# Patient Record
Sex: Male | Born: 1975 | Race: Black or African American | Hispanic: No | Marital: Single | Smoking: Never smoker
Health system: Southern US, Community
[De-identification: ages and names within clinical notes are randomized; demographics above are authoritative.]

## PROBLEM LIST (undated history)

## (undated) DIAGNOSIS — E785 Hyperlipidemia, unspecified: Secondary | ICD-10-CM

## (undated) DIAGNOSIS — E669 Obesity, unspecified: Secondary | ICD-10-CM

## (undated) DIAGNOSIS — N492 Inflammatory disorders of scrotum: Secondary | ICD-10-CM

## (undated) DIAGNOSIS — I10 Essential (primary) hypertension: Secondary | ICD-10-CM

## (undated) DIAGNOSIS — E119 Type 2 diabetes mellitus without complications: Secondary | ICD-10-CM

## (undated) HISTORY — DX: Type 2 diabetes mellitus without complications: E11.9

## (undated) HISTORY — DX: Inflammatory disorders of scrotum: N49.2

## (undated) HISTORY — DX: Obesity, unspecified: E66.9

## (undated) HISTORY — DX: Essential (primary) hypertension: I10

## (undated) HISTORY — DX: Hyperlipidemia, unspecified: E78.5

---

## 2000-08-01 ENCOUNTER — Emergency Department (HOSPITAL_COMMUNITY): Admission: EM | Admit: 2000-08-01 | Discharge: 2000-08-01 | Payer: Self-pay | Admitting: *Deleted

## 2000-08-15 ENCOUNTER — Emergency Department (HOSPITAL_COMMUNITY): Admission: EM | Admit: 2000-08-15 | Discharge: 2000-08-15 | Payer: Self-pay | Admitting: Emergency Medicine

## 2000-09-26 ENCOUNTER — Encounter: Admission: RE | Admit: 2000-09-26 | Discharge: 2000-09-26 | Payer: Self-pay | Admitting: Internal Medicine

## 2000-09-30 ENCOUNTER — Emergency Department (HOSPITAL_COMMUNITY): Admission: EM | Admit: 2000-09-30 | Discharge: 2000-09-30 | Payer: Self-pay

## 2000-12-09 ENCOUNTER — Emergency Department (HOSPITAL_COMMUNITY): Admission: EM | Admit: 2000-12-09 | Discharge: 2000-12-09 | Payer: Self-pay | Admitting: Emergency Medicine

## 2001-06-09 ENCOUNTER — Emergency Department (HOSPITAL_COMMUNITY): Admission: EM | Admit: 2001-06-09 | Discharge: 2001-06-09 | Payer: Self-pay | Admitting: Emergency Medicine

## 2003-08-14 ENCOUNTER — Emergency Department (HOSPITAL_COMMUNITY): Admission: EM | Admit: 2003-08-14 | Discharge: 2003-08-14 | Payer: Self-pay | Admitting: Emergency Medicine

## 2004-05-13 DIAGNOSIS — E119 Type 2 diabetes mellitus without complications: Secondary | ICD-10-CM

## 2004-05-13 HISTORY — DX: Type 2 diabetes mellitus without complications: E11.9

## 2005-04-09 ENCOUNTER — Emergency Department (HOSPITAL_COMMUNITY): Admission: EM | Admit: 2005-04-09 | Discharge: 2005-04-10 | Payer: Self-pay | Admitting: Emergency Medicine

## 2007-11-25 ENCOUNTER — Encounter: Payer: Self-pay | Admitting: Family Medicine

## 2007-11-25 LAB — CONVERTED CEMR LAB
HCT: 45.8 %
Hemoglobin: 15.2 g/dL
Hgb A1c MFr Bld: 10.7 %
MCV: 87 fL
Platelets: 299 10*3/uL
TSH: 3.26 microintl units/mL
WBC: 11 10*3/uL

## 2007-12-10 ENCOUNTER — Encounter: Payer: Self-pay | Admitting: Family Medicine

## 2007-12-10 LAB — CONVERTED CEMR LAB
BUN: 12 mg/dL
CO2: 25 meq/L
Calcium: 10.1 mg/dL
Chloride: 103 meq/L
Creatinine, Ser: 0.89 mg/dL
Glucose, Bld: 88 mg/dL
Potassium: 4.3 meq/L
Sodium: 143 meq/L

## 2009-06-20 ENCOUNTER — Encounter: Payer: Self-pay | Admitting: Family Medicine

## 2009-06-20 LAB — CONVERTED CEMR LAB: Vit D, 25-Hydroxy: 4.6 ng/mL

## 2009-07-21 ENCOUNTER — Encounter: Payer: Self-pay | Admitting: Family Medicine

## 2009-07-21 LAB — CONVERTED CEMR LAB
ALT: 21 units/L
AST: 12 units/L
Alkaline Phosphatase: 110 units/L
BUN: 13 mg/dL
CO2: 23 meq/L
Calcium: 10.2 mg/dL
Chloride: 94 meq/L
Creatinine, Ser: 0.96 mg/dL
Glucose, Bld: 421 mg/dL
Hemoglobin: 16.4 g/dL
Hgb A1c MFr Bld: 12.5 %
MCV: 88 fL
Platelets: 324 10*3/uL
Potassium: 4.3 meq/L
Sodium: 134 meq/L
TSH: 2.55 microintl units/mL
Total Bilirubin: 0.5 mg/dL
Total Protein: 8.2 g/dL
WBC: 11 10*3/uL

## 2010-04-17 ENCOUNTER — Ambulatory Visit: Payer: Self-pay | Admitting: Family Medicine

## 2010-04-17 DIAGNOSIS — E785 Hyperlipidemia, unspecified: Secondary | ICD-10-CM

## 2010-04-17 DIAGNOSIS — I10 Essential (primary) hypertension: Secondary | ICD-10-CM | POA: Insufficient documentation

## 2010-04-17 DIAGNOSIS — E113393 Type 2 diabetes mellitus with moderate nonproliferative diabetic retinopathy without macular edema, bilateral: Secondary | ICD-10-CM | POA: Insufficient documentation

## 2010-04-17 DIAGNOSIS — E113391 Type 2 diabetes mellitus with moderate nonproliferative diabetic retinopathy without macular edema, right eye: Secondary | ICD-10-CM | POA: Insufficient documentation

## 2010-04-17 DIAGNOSIS — E113293 Type 2 diabetes mellitus with mild nonproliferative diabetic retinopathy without macular edema, bilateral: Secondary | ICD-10-CM

## 2010-04-17 DIAGNOSIS — E1169 Type 2 diabetes mellitus with other specified complication: Secondary | ICD-10-CM | POA: Insufficient documentation

## 2010-04-18 LAB — CONVERTED CEMR LAB
ALT: 30 units/L (ref 0–53)
AST: 23 units/L (ref 0–37)
Albumin: 4.1 g/dL (ref 3.5–5.2)
Alkaline Phosphatase: 81 units/L (ref 39–117)
BUN: 10 mg/dL (ref 6–23)
Bilirubin, Direct: 0.2 mg/dL (ref 0.0–0.3)
CO2: 30 meq/L (ref 19–32)
Calcium: 9.5 mg/dL (ref 8.4–10.5)
Chloride: 100 meq/L (ref 96–112)
Cholesterol: 172 mg/dL (ref 0–200)
Creatinine, Ser: 0.8 mg/dL (ref 0.4–1.5)
GFR calc non Af Amer: 146.05 mL/min (ref >60.00)
Glucose, Bld: 203 mg/dL — ABNORMAL HIGH (ref 70–99)
HDL: 26.9 mg/dL — ABNORMAL LOW (ref >39.00)
Hgb A1c MFr Bld: 11 % — ABNORMAL HIGH (ref 4.6–6.5)
LDL Cholesterol: 128 mg/dL — ABNORMAL HIGH (ref 0–99)
Potassium: 4 meq/L (ref 3.5–5.1)
Sodium: 139 meq/L (ref 135–145)
TSH: 2 microintl units/mL (ref 0.35–5.50)
Total Bilirubin: 1.1 mg/dL (ref 0.3–1.2)
Total CHOL/HDL Ratio: 6
Total Protein: 8.2 g/dL (ref 6.0–8.3)
Triglycerides: 85 mg/dL (ref 0.0–149.0)
VLDL: 17 mg/dL (ref 0.0–40.0)

## 2010-04-24 ENCOUNTER — Telehealth: Payer: Self-pay | Admitting: Family Medicine

## 2010-04-29 ENCOUNTER — Encounter: Payer: Self-pay | Admitting: Family Medicine

## 2010-05-13 ENCOUNTER — Encounter: Payer: Self-pay | Admitting: Family Medicine

## 2010-05-18 ENCOUNTER — Ambulatory Visit
Admission: RE | Admit: 2010-05-18 | Discharge: 2010-05-18 | Payer: Self-pay | Source: Home / Self Care | Attending: Family Medicine | Admitting: Family Medicine

## 2010-05-22 ENCOUNTER — Telehealth: Payer: Self-pay | Admitting: Family Medicine

## 2010-06-12 NOTE — Assessment & Plan Note (Signed)
Summary: NEW PT TO ESTABH/DLO   Vital Signs:  Patient profile:   35 year old male Height:      71 inches Weight:      319 pounds BMI:     44.65 Temp:     98.7 degrees F oral Pulse rate:   76 / minute Pulse rhythm:   regular BP sitting:   140 / 98  (left arm) Cuff size:   large  Vitals Entered By: Selena Batten Dance CMA Duncan Dull) (April 17, 2010 9:45 AM) CC: New patient to establish care Comments Out of all meds for about 2 weeks   History of Present Illness: CC: new patient, establish  Previously saw [facility] in [place].  switched doctors around alot on him so decided to change to here (closer to home).  Also out of all meds for 2 wks.  1. HTN - losartan/HCTZ.  no HA, vision changes, chest pain, tightness, SOB, urinary changes, LE swelling.  chronic issue  2. DM - janumet 50/1000 two times a day.  had been doing pretty good on this med.  Usually checks in afternoon, 150 postprandial sugars.  Last vision screen several years ago.  Last foot check 2010.  chronic issue  3. HLD - simvastatin 40 daily, no muscle pains.  chronic issue  4. obesity - sedentary lifestyle.  thinks could incorporate walking into routine.  chronic issue  Preventative: declines flu shot. tetanus shot - unsure. hasn't had blood work in a while.  truck driver.  Current Medications (verified): 1)  Janumet 50-1000 Mg Tabs (Sitagliptin-Metformin Hcl) .Marland Kitchen.. 1 By Mouth Two Times A Day 2)  Losartan Potassium-Hctz 100-12.5 Mg Tabs (Losartan Potassium-Hctz) .Marland Kitchen.. 1 By Mouth Once Daily 3)  Simvastatin 40 Mg Tabs (Simvastatin) .Marland Kitchen.. 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    Physical Exam  General:  Well-developed,well-nourished,in no acute distress; alert,appropriate and cooperative throughout examination Head:  Normocephalic and atraumatic without obvious abnormalities. No apparent alopecia or balding. Eyes:  PERRLA, EOMI, no injection Ears:  TMs clear Nose:  nares clear bilaterally Mouth:  MMM, no pharyngeal erythema/edema Neck:  No deformities, masses, or tenderness noted.  no LAD, no thyromegaly Lungs:  Normal respiratory effort, chest expands symmetrically. Lungs are clear to auscultation, no crackles or wheezes. Heart:  Normal rate and regular rhythm. S1 and S2 normal without gallop, murmur, click, rub or other extra sounds. Abdomen:  Bowel sounds positive,abdomen soft and non-tender without masses, organomegaly or hernias noted. Msk:  No deformity or scoliosis noted of thoracic or lumbar spine.   Pulses:  2+ rad pulses Extremities:  no pedaledema Neurologic:  CNs grossly intact, station and gait intact Skin:  Intact without suspicious lesions or rashes Psych:  full affect, pleasant and coooperative  Diabetes Management Exam:    Foot Exam (with socks and/or shoes not present):       Sensory-Pinprick/Light  touch:          Left medial foot (L-4): normal          Left dorsal foot (L-5): normal          Left lateral foot (S-1): normal          Right medial foot (L-4): normal          Right dorsal foot (L-5): normal          Right lateral foot (S-1): normal       Sensory-Monofilament:          Left foot: normal          Right foot: normal       Inspection:          Left foot: normal           Right foot: normal       Nails:          Left foot: normal          Right foot: normal   Impression & Recommendations:  Problem # 1:  OBESITY (ICD-278.00) discussed increasing activity level.  Ht: 71 (04/17/2010)   Wt: 319 (04/17/2010)   BMI: 44.65 (04/17/2010)  Problem # 2:  DIABETES MELLITUS, TYPE II (ICD-250.00) check blood work today.   His updated medication list for this problem includes:    Janumet 50-1000 Mg Tabs (Sitagliptin-metformin hcl) .Marland Kitchen... 1 by mouth two times a day    Losartan Potassium-hctz 100-12.5 Mg Tabs (Losartan potassium-hctz) .Marland Kitchen... 1 by mouth once daily  Orders: TLB-A1C / Hgb A1C (Glycohemoglobin) (83036-A1C)  Problem # 3:  HYPERTENSION (ICD-401.9) elevated today.  off meds x 2 wks.  refilled locally x 1 mo then provided patient with 3 mo supply to mail in. His updated medication list for this problem includes:    Losartan Potassium-hctz 100-12.5 Mg Tabs (Losartan potassium-hctz) .Marland Kitchen... 1 by mouth once daily  Orders: TLB-BMP (Basic Metabolic Panel-BMET) (80048-METABOL) TLB-TSH (Thyroid Stimulating Hormone) (84443-TSH)  BP today: 140/98  Problem # 4:  HYPERLIPIDEMIA (ICD-272.4) check blood work today - fasting. His updated medication list for this problem includes:    Simvastatin 40 Mg Tabs (Simvastatin) .Marland Kitchen... 1 by mouth once daily  Orders: TLB-Lipid Panel (80061-LIPID) TLB-Hepatic/Liver Function Pnl (80076-HEPATIC)  Complete Medication List: 1)  Janumet 50-1000 Mg Tabs (Sitagliptin-metformin hcl) .Marland Kitchen.. 1 by mouth two times a day 2)  Losartan Potassium-hctz 100-12.5 Mg Tabs (Losartan potassium-hctz) .Marland Kitchen.. 1 by mouth once daily 3)  Simvastatin 40 Mg Tabs (Simvastatin) .Marland Kitchen.. 1 by mouth once daily  Patient Instructions: 1)  Release of records from prior PCP ([facility] at [facility] on [address]). 2)  Please return in 1 month for follow up.  Sooner if needed. 3)  Incorporate walking into daily routine. 4)  Watch sodas and juices with  diabetes. 5)  Blood work today. 6)  Good to meet you today, call clinic with questions. Prescriptions: SIMVASTATIN 40 MG TABS (SIMVASTATIN) 1 by mouth once daily  #90 x 3   Entered and Authorized by:   Eustaquio Boyden  MD   Signed by:   Eustaquio Boyden  MD on 04/17/2010   Method used:   Print then Give to Patient   RxID:   [id number] JYNWGNFA POTASSIUM-HCTZ 100-12.5 MG TABS (LOSARTAN POTASSIUM-HCTZ) 1 by mouth once daily  #90 x 3   Entered and Authorized by:   Eustaquio Boyden  MD   Signed by:   Eustaquio Boyden  MD on 04/17/2010   Method  used:   Print then Give to Patient   RxID:   [id number] JANUMET 50-1000 MG TABS (SITAGLIPTIN-METFORMIN HCL) 1 by mouth two times a day  #90 x 3   Entered and Authorized by:   Eustaquio Boyden  MD   Signed by:   Eustaquio Boyden  MD on 04/17/2010   Method used:   Print then Give to Patient   RxID:   [id number] SIMVASTATIN 40 MG TABS (SIMVASTATIN) 1 by mouth once daily  #30 x 1   Entered and Authorized by:   Eustaquio Boyden  MD   Signed by:   Eustaquio Boyden  MD on 04/17/2010   Method used:   Electronically to        [facility]* (retail)       [address]       [place], [place]  [address]       Ph: [phone]       Fax: [phone]   RxID:   [id number] JJKKXFGH POTASSIUM-HCTZ 100-12.5 MG TABS (LOSARTAN POTASSIUM-HCTZ) 1 by mouth once daily  #30 x 1   Entered and Authorized by:   Eustaquio Boyden  MD   Signed by:   Eustaquio Boyden  MD on 04/17/2010   Method used:   Electronically to        [facility]* (retail)       [address]       [place], [place]  [address]       Ph: [phone]       Fax: [phone]   RxID:   [id number] JANUMET 50-1000 MG TABS (SITAGLIPTIN-METFORMIN HCL) 1 by mouth two times a day  #30 x 1   Entered and Authorized by:   Eustaquio Boyden  MD   Signed by:   Eustaquio Boyden  MD on 04/17/2010   Method used:   Electronically to        [facility]* (retail)       [address]       [place], [place]  [address]       Ph: [phone]       Fax: [phone]   RxID:   [id number]    Orders Added: 1)  TLB-Lipid Panel [80061-LIPID] 2)  TLB-BMP (Basic Metabolic Panel-BMET) [80048-METABOL] 3)  TLB-Hepatic/Liver Function Pnl [80076-HEPATIC] 4)  TLB-TSH (Thyroid Stimulating Hormone) [84443-TSH] 5)  TLB-A1C / Hgb A1C (Glycohemoglobin) [83036-A1C] 6)  Est. Patient Level III [[address]]    Current Allergies (reviewed today): No known allergies

## 2010-06-14 NOTE — Medication Information (Signed)
Summary: Clarification for Janumet/[facility]  Clarification for Janumet/[facility]   Imported By: Lanelle Bal 04/30/2010 11:34:04  _____________________________________________________________________  External Attachment:    Type:   Image     Comment:   External Document
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
## 2010-06-14 NOTE — Progress Notes (Signed)
Summary: Outgoing call to patient    ---- Converted from flag ---- ---- 05/21/2010 6:21 PM, Eustaquio Boyden  MD wrote: can we check with patient and offer that to see if he'd be able to go?  if so, let me know and I will put referral into chart.  ---- 05/21/2010 3:27 PM, Selena Batten Dance CMA (AAMA) wrote: The latest they can do is a 4 PM  ---- 05/21/2010 3:04 PM, Eustaquio Boyden  MD wrote: how late friday?  tahnks for checking!  ---- 05/21/2010 2:33 PM, Selena Batten Dance CMA (AAMA) wrote: No weekends at either place. [facility] has late Friday appts where [facility] closes at 12 on Fridays. They couldn't think of anywhere that has weekend hours. Sorry!  ---- 05/21/2010 11:16 AM, Eustaquio Boyden  MD wrote: can you call to see what is available for weekends for diabetes education?  (either at [facility] or [facility]) thanks. ------------------------------ Spoke with patient and he advised he would have to get back to me on whether he could go or not. I explained no weekend classes available and that [facility] had the latest appts on Fridays and they anticipated just 2 classes as a refresher for him. He verbalized understanding and said he would call us back to let us know about the referral. Selena Batten Dance CMA Duncan Dull)  May 22, 2010 3:39 PM   noted. thanks. Eustaquio Boyden  MD  May 22, 2010 5:23 PM

## 2010-06-14 NOTE — Progress Notes (Signed)
Summary: form from [facility]  Phone Note Refill Request Message from:  Fax from Pharmacy  Refills Requested: Medication #1:  JANUMET 50-1000 MG TABS 1 by mouth two times a day Pharmacy is asking for a quantity change, form from [facility] is in your in box.  Initial call taken by: Lowella Petties CMA, AAMA,  April 24, 2010 10:25 AM  Follow-up for Phone Call        done, routed to kim. Follow-up by: Eustaquio Boyden  MD,  April 24, 2010 11:05 AM  Additional Follow-up for Phone Call Additional follow up Details #1::        Form faxed to [facility] Additional Follow-up by: Janee Morn CMA Duncan Dull),  April 24, 2010 11:34 AM    Prescriptions: JANUMET 50-1000 MG TABS (SITAGLIPTIN-METFORMIN HCL) 1 by mouth two times a day  #180 x 3   Entered and Authorized by:   Eustaquio Boyden  MD   Signed by:   Eustaquio Boyden  MD on 04/24/2010   Method used:   Electronically to        [facility]* (retail)             ,          Ph: [phone]       Fax: [phone]   RxID:   [id number]

## 2010-06-18 ENCOUNTER — Ambulatory Visit: Payer: Self-pay | Admitting: Family Medicine

## 2010-07-06 ENCOUNTER — Encounter: Payer: Self-pay | Admitting: Family Medicine

## 2010-07-06 ENCOUNTER — Ambulatory Visit (INDEPENDENT_AMBULATORY_CARE_PROVIDER_SITE_OTHER): Payer: BC Managed Care – PPO | Admitting: Family Medicine

## 2010-07-06 DIAGNOSIS — E669 Obesity, unspecified: Secondary | ICD-10-CM

## 2010-07-06 DIAGNOSIS — E785 Hyperlipidemia, unspecified: Secondary | ICD-10-CM

## 2010-07-06 DIAGNOSIS — I10 Essential (primary) hypertension: Secondary | ICD-10-CM

## 2010-07-06 DIAGNOSIS — E119 Type 2 diabetes mellitus without complications: Secondary | ICD-10-CM

## 2010-07-10 NOTE — Assessment & Plan Note (Signed)
Summary: 1 month f/u JRT   Vital Signs:  Patient profile:   35 year old male Weight:      317 pounds Temp:     98.6 degrees F oral Pulse rate:   68 / minute Pulse rhythm:   regular BP sitting:   140 / 98  (left arm) Cuff size:   large  Vitals Entered By: Lowella Petties CMA, AAMA (July 06, 2010 4:32 PM) CC: One month follow up   History of Present Illness: CC: 1 mo f/u DM  1. DM - brings log of sugars.  actually looking great - 2 hour post prandial range 70-150s. feels more active.  not jump roping as much.   taking janumet.  no paresthesias or numbness fingers or toes.  has not set up with diabetes education.  no lows, lowest has been 79.  knows sxs of low sugar.    2. obesity - tries to walk when he can.  misplaced jump rope.  up 4 lbs since last visit.  tries to watch diet but rarely does salads.  does try to eat grapes and cooked vegetables.  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  3. HTN - checks bp at [facility], running normal.  no HA, vision changes, chest pain, tightness, SOB, urinary changes, LE swelling.    4. HLD - tolerating simvastatin ok.  takes nightly.  Current Medications (verified): 1)  Janumet 50-1000 Mg Tabs (Sitagliptin-Metformin Hcl) .Marland Kitchen.. 1 By Mouth Two Times A Day 2)  Losartan Potassium-Hctz 100-12.5 Mg Tabs (Losartan Potassium-Hctz) .Marland Kitchen.. 1 By Mouth Once Daily 3)  Simvastatin 40 Mg Tabs (Simvastatin) .Marland Kitchen.. 1 By Mouth Once Nightly For Cholesterol 4)  Glimepiride 2 Mg Tabs (Glimepiride) .... One Daily 5)  Multivitamins  Tabs (Multiple Vitamin) .... 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 No apparent alopecia or balding. Mouth:  MMM, no pharyngeal erythema/edema Neck:  No deformities, masses, or tenderness noted.  no LAD, no thyromegaly Lungs:  Normal respiratory effort, chest expands symmetrically. Lungs are clear to auscultation, no crackles or wheezes. Heart:  Normal rate and regular rhythm. S1 and S2 normal without gallop, murmur, click, rub or other extra sounds. Pulses:  2+ rad pulses   Impression & Recommendations:  Problem # 1:  DIABETES MELLITUS, TYPE II (ICD-250.00) much improved based on blood sugars shared today.  no lows.  continue current meds.  due for A1c next visit.  His updated medication list for this problem includes:    Janumet 50-1000 Mg Tabs (Sitagliptin-metformin hcl) .Marland Kitchen... 1 by mouth two times a day    Losartan Potassium-hctz 100-25 Mg Tabs (Losartan potassium-hctz) .Marland Kitchen... Take one daily for blood pressure    Glimepiride 2 Mg Tabs (Glimepiride) ..... One daily  Labs Reviewed: Creat: 0.8 (04/17/2010)    Reviewed HgBA1c results: 11.0 (04/17/2010)  12.5 (07/21/2009)  Problem # 2:  HYPERTENSION (ICD-401.9) Assessment: Unchanged not optimal,  neg ros.  increase HCTZ component.  His updated medication list for this problem includes:    Losartan Potassium-hctz 100-25 Mg Tabs (Losartan potassium-hctz) .Marland Kitchen... Take one daily for blood pressure  BP today: 140/98 Prior BP: 132/96 (05/18/2010)  Labs Reviewed: K+: 4.0 (04/17/2010) Creat: : 0.8 (04/17/2010)   Chol: 172 (04/17/2010)   HDL: 26.90 (04/17/2010)   LDL: 128 (04/17/2010)   TG: 85.0 (04/17/2010)  Problem # 3:  OBESITY (ICD-278.00)  wt up.  rec restart jump rope, walking.  Ht: 71 (04/17/2010)   Wt: 317 (07/06/2010)   BMI: 44.65  (04/17/2010)  Problem # 4:  HYPERLIPIDEMIA (ICD-272.4) last LDL too high, tolerating simvastatin.  change to more potent atorvastatin when generic. His updated medication list for this problem includes:    Simvastatin 40 Mg Tabs (Simvastatin) .Marland Kitchen... 1 by mouth once nightly for cholesterol  Labs Reviewed: SGOT: 23 (04/17/2010)   SGPT: 30 (04/17/2010)   HDL:26.90 (04/17/2010)  LDL:128 (04/17/2010)  Chol:172 (04/17/2010)  Trig:85.0 (04/17/2010)  Complete Medication List: 1)  Janumet 50-1000 Mg Tabs (Sitagliptin-metformin hcl) .Marland Kitchen.. 1 by mouth two times a day 2)  Losartan Potassium-hctz 100-25 Mg Tabs (Losartan potassium-hctz) .... Take one daily for blood pressure 3)  Simvastatin 40 Mg Tabs (Simvastatin) .Marland Kitchen.. 1 by mouth once nightly for cholesterol 4)  Glimepiride 2 Mg Tabs (Glimepiride) .... One daily  Patient Instructions: 1)  Return late march or early april last appt on friday for f/u. 2)  goal blood pressure for you is <130/80. 3)  call to set up diabetes education. 4)  call to set up vision check. 5)  increase losartan hctz to 100/25 daily for blood pressure. 6)  Good to see you today, call clinic with questions. Prescriptions: LOSARTAN POTASSIUM-HCTZ 100-25 MG TABS (LOSARTAN POTASSIUM-HCTZ) take one daily for blood pressure  #90 x 1   Entered and Authorized by:   Eustaquio Boyden  MD   Signed by:   Eustaquio Boyden  MD on 07/06/2010   Method used:   Electronically to        [facility]* (retail)             ,          Ph: [phone]       Fax: [phone]   RxID:   [id number] GLIMEPIRIDE 2 MG TABS (GLIMEPIRIDE) one daily  #90 x 1   Entered and Authorized by:   Eustaquio Boyden  MD   Signed by:   Eustaquio Boyden  MD on 07/06/2010   Method used:   Electronically to        [facility]* (retail)             ,          Ph: [phone]       Fax: [phone]   RxID:   [id number]    Orders Added: 1)  Est. Patient Level IV [88416]    Prior  Medications (reviewed today): JANUMET 50-1000 MG TABS (SITAGLIPTIN-METFORMIN HCL) 1 by mouth two times a day GLIMEPIRIDE 2 MG TABS (GLIMEPIRIDE) one daily Current Allergies (reviewed today): No known allergies

## 2010-07-16 ENCOUNTER — Encounter: Payer: Self-pay | Admitting: Family Medicine

## 2010-07-16 DIAGNOSIS — E669 Obesity, unspecified: Secondary | ICD-10-CM

## 2010-07-16 DIAGNOSIS — I10 Essential (primary) hypertension: Secondary | ICD-10-CM

## 2010-07-16 DIAGNOSIS — E785 Hyperlipidemia, unspecified: Secondary | ICD-10-CM

## 2010-07-16 DIAGNOSIS — E119 Type 2 diabetes mellitus without complications: Secondary | ICD-10-CM

## 2010-09-07 ENCOUNTER — Ambulatory Visit: Payer: BC Managed Care – PPO | Admitting: Family Medicine

## 2010-09-14 ENCOUNTER — Ambulatory Visit (INDEPENDENT_AMBULATORY_CARE_PROVIDER_SITE_OTHER): Payer: BC Managed Care – PPO | Admitting: Family Medicine

## 2010-09-14 ENCOUNTER — Encounter: Payer: Self-pay | Admitting: Family Medicine

## 2010-09-14 VITALS — BP 122/82 | HR 80 | Temp 98.4°F | Ht 71.0 in | Wt 317.0 lb

## 2010-09-14 DIAGNOSIS — E669 Obesity, unspecified: Secondary | ICD-10-CM

## 2010-09-14 DIAGNOSIS — E785 Hyperlipidemia, unspecified: Secondary | ICD-10-CM

## 2010-09-14 DIAGNOSIS — I10 Essential (primary) hypertension: Secondary | ICD-10-CM

## 2010-09-14 DIAGNOSIS — E119 Type 2 diabetes mellitus without complications: Secondary | ICD-10-CM

## 2010-09-14 DIAGNOSIS — Z23 Encounter for immunization: Secondary | ICD-10-CM

## 2010-09-14 NOTE — Progress Notes (Signed)
  Subjective:    Patient ID: Jason Whitney, male    DOB: 05-30-1975, 35 y.o.   MRN: [id number]  HPI CC: 29mo f/u  Truck driver.  Issues dealing with [facility] - states thinking about changing pharmacy to [facility].  DM - janumet and glipizide daily.  Previously went through [facility] and cheaper.  No vision check recently.  States will schedule.  No low sugars.  No highs.  Trying to watch diet.  Weight 317lbs.  Checked today 3 hours after lunch, 87.  Fasting running low 80.  After meals running 80-120s.  HTN - No HA, vision changes, CP/tightness, SOB, leg swelling.  Compliant with meds.    HLD - tolerating zocor daily.  No myalgias.  Wt Readings from Last 3 Encounters:  09/14/10 317 lb 0.6 oz (143.808 kg)  07/06/10 317 lb (143.79 kg)  05/18/10 313 lb 4 oz (142.089 kg)   Medications and allergies reviewed and updated as above. PMhx reviewed for relevance  Review of Systems Per HPI    Objective:   Physical Exam  Vitals reviewed. Constitutional: He appears well-developed and well-nourished. No distress.  HENT:  Head: Normocephalic and atraumatic.  Mouth/Throat: Oropharynx is clear and moist. No oropharyngeal exudate.  Eyes: Conjunctivae are normal. Pupils are equal, round, and reactive to light. No scleral icterus.  Neck: Normal range of motion. Neck supple. Carotid bruit is not present.  Cardiovascular: Normal rate, regular rhythm, normal heart sounds and intact distal pulses.   No murmur heard. Pulmonary/Chest: Effort normal and breath sounds normal. No respiratory distress. He has no wheezes. He has no rales.  Abdominal: Soft. Bowel sounds are normal.       No abd/renal bruits  Lymphadenopathy:    He has no cervical adenopathy.  Skin: Skin is warm. No rash noted.  Psychiatric: He has a normal mood and affect.       Diabetic foot exam: Normal inspection No skin breakdown No calluses  Normal DP/PT pulses Normal sensation to light tough and monofilament Nails  normal    Assessment & Plan:

## 2010-09-14 NOTE — Patient Instructions (Signed)
Blood work today. Return in 3 months for follow up. Keep log 1 week prior to returning with fasting sugars and then sugars either 2 hours after lunch or dinner.  Bring for [place] to review. Let me know where you want meds sent. Call us with questions.

## 2010-09-15 LAB — HEMOGLOBIN A1C
Hgb A1c MFr Bld: 7.1 % — ABNORMAL HIGH (ref <5.7)
Mean Plasma Glucose: 157 mg/dL — ABNORMAL HIGH (ref <117)

## 2010-09-15 LAB — BASIC METABOLIC PANEL
CO2: 25 mEq/L (ref 19–32)
Chloride: 97 mEq/L (ref 96–112)
Sodium: 137 mEq/L (ref 135–145)

## 2010-09-16 ENCOUNTER — Encounter: Payer: Self-pay | Admitting: Family Medicine

## 2010-09-16 ENCOUNTER — Telehealth: Payer: Self-pay | Admitting: Family Medicine

## 2010-09-16 MED ORDER — ATORVASTATIN CALCIUM 20 MG PO TABS: 20.0000 mg | ORAL_TABLET | Freq: Every day | ORAL | Status: DC

## 2010-09-16 NOTE — Assessment & Plan Note (Signed)
Continue med, due for recheck.  Not fasting today. Lab Results  Component Value Date   LDLCALC 128* 04/17/2010

## 2010-09-16 NOTE — Assessment & Plan Note (Signed)
Lab Results  Component Value Date   HGBA1C 7.1* 09/14/2010   HGBA1C 11.0* 04/17/2010   HGBA1C 12.5 07/21/2009   Lab Results  Component Value Date   LDLCALC 128* 04/17/2010   CREATININE 0.97 09/14/2010   Better control per numbers, check again today. Due for vision screen - pt states will schedule. Foot exam today. Encouraged continued use of meds.

## 2010-09-16 NOTE — Assessment & Plan Note (Signed)
Good control.  Continue meds.  BP Readings from Last 3 Encounters:  09/14/10 122/82  07/06/10 140/98  05/18/10 132/96

## 2010-09-16 NOTE — Telephone Encounter (Signed)
Please notify A1c 7.1% - GREAT IMPROVEMENT!! from prior of 11.  Continue meds as up to now.  When returns for 3 mo f/u will look at log of sugars and see if any med changes needed. Electrolytes and kidney function normal. LDL improved at 113 but still above goal of <100.  Recommend change from simvastatin to atorvastatin (more potent statin).  May price out first.  Sent to pharmacy [facility]).

## 2010-09-17 NOTE — Progress Notes (Signed)
Addended by: Josph Macho on: 09/17/2010 10:47 AM   Modules accepted: Orders

## 2010-09-17 NOTE — Telephone Encounter (Signed)
Patient notified and was VERY happy with improvements! He will continue DM meds and will change to atorvastatin when it arrives.

## 2010-12-14 ENCOUNTER — Ambulatory Visit: Payer: BC Managed Care – PPO | Admitting: Family Medicine

## 2011-01-25 ENCOUNTER — Ambulatory Visit: Payer: BC Managed Care – PPO | Admitting: Family Medicine

## 2011-07-31 ENCOUNTER — Other Ambulatory Visit: Payer: Self-pay | Admitting: *Deleted

## 2011-07-31 ENCOUNTER — Other Ambulatory Visit: Payer: Self-pay | Admitting: Family Medicine

## 2011-07-31 MED ORDER — LOSARTAN POTASSIUM-HCTZ 100-25 MG PO TABS: 1.0000 | ORAL_TABLET | Freq: Every day | ORAL | Status: DC

## 2011-07-31 MED ORDER — SITAGLIPTIN PHOS-METFORMIN HCL 50-1000 MG PO TABS: 1.0000 | ORAL_TABLET | Freq: Two times a day (BID) | ORAL | Status: DC

## 2011-07-31 MED ORDER — GLIMEPIRIDE 2 MG PO TABS: 2.0000 mg | ORAL_TABLET | Freq: Every day | ORAL | Status: DC

## 2011-12-04 ENCOUNTER — Other Ambulatory Visit: Payer: Self-pay | Admitting: *Deleted

## 2011-12-04 MED ORDER — GLUCOSE BLOOD VI STRP: ORAL_STRIP | Status: DC

## 2011-12-11 ENCOUNTER — Ambulatory Visit (INDEPENDENT_AMBULATORY_CARE_PROVIDER_SITE_OTHER): Payer: BC Managed Care – PPO | Admitting: Family Medicine

## 2011-12-11 ENCOUNTER — Encounter: Payer: Self-pay | Admitting: Family Medicine

## 2011-12-11 VITALS — BP 140/92 | HR 68 | Temp 98.0°F | Wt 328.2 lb

## 2011-12-11 DIAGNOSIS — E669 Obesity, unspecified: Secondary | ICD-10-CM

## 2011-12-11 DIAGNOSIS — E119 Type 2 diabetes mellitus without complications: Secondary | ICD-10-CM

## 2011-12-11 DIAGNOSIS — E785 Hyperlipidemia, unspecified: Secondary | ICD-10-CM

## 2011-12-11 DIAGNOSIS — I10 Essential (primary) hypertension: Secondary | ICD-10-CM

## 2011-12-11 NOTE — Assessment & Plan Note (Signed)
Chronic.  Some deteriorated.  weight gain noted. BP Readings from Last 3 Encounters:  12/11/11 140/92  09/14/10 122/82  07/06/10 140/98   continue meds.

## 2011-12-11 NOTE — Progress Notes (Signed)
  Subjective:    Patient ID: Jason Whitney, male    DOB: [birth year], 36 y.o.   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  HTN - elevated today.  Had DOT physical Friday, states bp was 139/88.  No HA, vision changes, CP/tightness, SOB, leg swelling.   DM - checked 2 days ago, fasting 75.  Checks about qod.  No low sugars.  No hypoglycemic sxs.  No paresthesias.  Vision exam - last august.  Due for this.  Foot exam today.   Lab Results  Component Value Date   HGBA1C 7.1* 09/14/2010   HLD - compliant with lipitor, but takes in am.  No myalgias.  Obesity - 11lb weight gain in last year. Wt Readings from Last 3 Encounters:  12/11/11 328 lb 4 oz (148.893 kg)  09/14/10 317 lb 0.6 oz (143.808 kg)  07/06/10 317 lb (143.79 kg)    Prefers not to have meds refilled today, would like to change pharmacy to [facility].  Past Medical History  Diagnosis Date  . HTN (hypertension)   . HLD (hyperlipidemia)   . Diabetes mellitus type II 2006  . Obesity    Current Outpatient Prescriptions on File Prior to Visit  Medication Sig Dispense Refill  . atorvastatin (LIPITOR) 20 MG tablet Take 1 tablet (20 mg total) by mouth at bedtime.  90 tablet  3  . glimepiride (AMARYL) 2 MG tablet TAKE 1 TABLET DAILY  90 tablet  0  . glucose blood test strip Test daily as directed.  100 each  3  . JANUMET 50-1000 MG per tablet TAKE 1 TABLET TWICE A DAY  180 tablet  0  . losartan-hydrochlorothiazide (HYZAAR) 100-25 MG per tablet TAKE 1 TABLET DAILY FOR BLOOD PRESSURE  90 tablet  0    Review of Systems Per HPI    Objective:   Physical Exam  Nursing note and vitals reviewed. Constitutional: He appears well-developed and well-nourished. No distress.  HENT:  Head: Normocephalic and atraumatic.  Right Ear: External ear normal.  Left Ear: External ear normal.  Nose: Nose normal.  Mouth/Throat: Oropharynx is clear and moist. No oropharyngeal  exudate.  Eyes: Conjunctivae and EOM are normal. Pupils are equal, round, and reactive to light. No scleral icterus.  Neck: Normal range of motion. Neck supple. Carotid bruit is not present.  Cardiovascular: Normal rate, regular rhythm, normal heart sounds and intact distal pulses.   No murmur heard. Pulmonary/Chest: Effort normal and breath sounds normal. No respiratory distress. He has no wheezes. He has no rales.  Musculoskeletal: He exhibits no edema.       cDiabetic foot exam: Normal inspection No skin breakdown No calluses  Normal DP/PT pulses Normal sensation to light tough and monofilament Nails normal  Lymphadenopathy:    He has no cervical adenopathy.  Skin: Skin is warm and dry. No rash noted.  Psychiatric: He has a normal mood and affect.       Assessment & Plan:

## 2011-12-11 NOTE — Assessment & Plan Note (Signed)
Chronic. Discussed PM vs AM dosing.  rec whichever he will more easily remember.

## 2011-12-11 NOTE — Patient Instructions (Addendum)
Good to see you today. Blood work today. Return in 6 months for follow up. Call us with questions. No changes today.  work on increased activity - start with 30 min of aerobic exercise 3-4 times daily.

## 2011-12-11 NOTE — Assessment & Plan Note (Signed)
Wt Readings from Last 3 Encounters:  12/11/11 328 lb 4 oz (148.893 kg)  09/14/10 317 lb 0.6 oz (143.808 kg)  07/06/10 317 lb (143.79 kg)  discussed weight.  Encouraged regular exercise, careful adherence to diabetic diet. Difficult given his work, but encouraged to continue working towards this.

## 2011-12-11 NOTE — Assessment & Plan Note (Signed)
Chronic, stable.  Anticipate good control based on recalled readings.  No log today.  Checks sugars QOD Check blood work today. Encouraged continued compliance with meds.

## 2011-12-12 LAB — COMPREHENSIVE METABOLIC PANEL
CO2: 30 mEq/L (ref 19–32)
Creatinine, Ser: 1 mg/dL (ref 0.4–1.5)
GFR: 105.27 mL/min (ref >60.00)
Glucose, Bld: 103 mg/dL — ABNORMAL HIGH (ref 70–99)
Total Bilirubin: 0.7 mg/dL (ref 0.3–1.2)
Total Protein: 8.5 g/dL — ABNORMAL HIGH (ref 6.0–8.3)

## 2011-12-12 LAB — LIPID PANEL
Cholesterol: 199 mg/dL (ref 0–200)
HDL: 32.4 mg/dL — ABNORMAL LOW (ref >39.00)
Triglycerides: 128 mg/dL (ref 0.0–149.0)

## 2011-12-12 LAB — MICROALBUMIN / CREATININE URINE RATIO
Creatinine,U: 191.5 mg/dL
Microalb Creat Ratio: 2.7 mg/g (ref 0.0–30.0)

## 2011-12-12 LAB — HEMOGLOBIN A1C: Hgb A1c MFr Bld: 9.6 % — ABNORMAL HIGH (ref 4.6–6.5)

## 2012-01-28 ENCOUNTER — Other Ambulatory Visit: Payer: Self-pay

## 2012-01-28 MED ORDER — ATORVASTATIN CALCIUM 20 MG PO TABS: 20.0000 mg | ORAL_TABLET | Freq: Every day | ORAL | Status: DC

## 2012-01-28 MED ORDER — SITAGLIPTIN PHOS-METFORMIN HCL 50-1000 MG PO TABS: 1.0000 | ORAL_TABLET | Freq: Two times a day (BID) | ORAL | Status: DC

## 2012-01-28 MED ORDER — GLIMEPIRIDE 2 MG PO TABS: 2.0000 mg | ORAL_TABLET | Freq: Every day | ORAL | Status: DC

## 2012-01-28 MED ORDER — LOSARTAN POTASSIUM-HCTZ 100-25 MG PO TABS: 1.0000 | ORAL_TABLET | Freq: Every day | ORAL | Status: DC

## 2012-01-28 NOTE — Telephone Encounter (Signed)
Pt request written rx for atorvastatin,glimepiride, janumet and losartan -HCTZ. Call when ready for pick up.

## 2012-01-28 NOTE — Telephone Encounter (Signed)
Patient notified and Rx placed up front for pick up. 

## 2012-01-28 NOTE — Telephone Encounter (Signed)
Printed out.  plz notify ready.

## 2012-01-30 NOTE — Telephone Encounter (Signed)
Pt brought back rx to be faxed to [facility] [phone]. Due to insurance guidelines 90 day rx cannot be filled at local pharmacy. Pt to ck with [facility] to verify received rxs and are processing. Pt understood.

## 2012-02-04 ENCOUNTER — Other Ambulatory Visit: Payer: Self-pay | Admitting: *Deleted

## 2012-02-04 MED ORDER — SITAGLIPTIN PHOS-METFORMIN HCL 50-1000 MG PO TABS: 1.0000 | ORAL_TABLET | Freq: Two times a day (BID) | ORAL | Status: DC

## 2012-02-07 ENCOUNTER — Other Ambulatory Visit: Payer: Self-pay | Admitting: Family Medicine

## 2012-02-07 ENCOUNTER — Other Ambulatory Visit: Payer: Self-pay | Admitting: *Deleted

## 2012-02-07 MED ORDER — GLIMEPIRIDE 2 MG PO TABS: 2.0000 mg | ORAL_TABLET | Freq: Every day | ORAL | Status: DC

## 2012-02-07 MED ORDER — LOSARTAN POTASSIUM-HCTZ 100-25 MG PO TABS: 1.0000 | ORAL_TABLET | Freq: Every day | ORAL | Status: DC

## 2012-02-07 MED ORDER — SITAGLIPTIN PHOS-METFORMIN HCL 50-1000 MG PO TABS: 1.0000 | ORAL_TABLET | Freq: Two times a day (BID) | ORAL | Status: DC

## 2012-02-07 MED ORDER — ATORVASTATIN CALCIUM 20 MG PO TABS: 20.0000 mg | ORAL_TABLET | Freq: Every day | ORAL | Status: DC

## 2012-02-07 NOTE — Telephone Encounter (Signed)
The patient called the triage line hoping to get his four medications refilled through [facility].  He stated [facility] did not have the rx information.  He is hoping for a 90 day supply.   Thanks!

## 2012-02-07 NOTE — Telephone Encounter (Signed)
Pt request refills atorvastatin,glimepiride, janumet and losartan hctz to [facility]. Sent electronically; pt notified by phone.

## 2012-02-07 NOTE — Telephone Encounter (Signed)
Refilled meds as requested; pt will ck with [facility] in 2 days to verify received refills and are processing.

## 2012-02-11 ENCOUNTER — Other Ambulatory Visit: Payer: Self-pay | Admitting: *Deleted

## 2012-02-11 ENCOUNTER — Telehealth: Payer: Self-pay | Admitting: *Deleted

## 2012-02-11 MED ORDER — GLIMEPIRIDE 2 MG PO TABS: 2.0000 mg | ORAL_TABLET | Freq: Every day | ORAL | Status: DC

## 2012-02-11 NOTE — Telephone Encounter (Signed)
Signed and handed to Kim 

## 2012-02-11 NOTE — Telephone Encounter (Signed)
Form for glimepiride refill in your IN box. Requires signature. Cannot get [employer] to go through after numerous attempts.

## 2012-02-11 NOTE — Telephone Encounter (Signed)
Form faxed

## 2012-03-13 ENCOUNTER — Ambulatory Visit: Payer: BC Managed Care – PPO | Admitting: Family Medicine

## 2012-05-22 ENCOUNTER — Ambulatory Visit: Payer: BC Managed Care – PPO | Admitting: Family Medicine

## 2012-06-12 ENCOUNTER — Ambulatory Visit: Payer: BC Managed Care – PPO | Admitting: Family Medicine

## 2012-11-19 ENCOUNTER — Other Ambulatory Visit: Payer: Self-pay

## 2012-11-30 ENCOUNTER — Encounter: Payer: Self-pay | Admitting: Family Medicine

## 2012-11-30 ENCOUNTER — Ambulatory Visit (INDEPENDENT_AMBULATORY_CARE_PROVIDER_SITE_OTHER): Payer: BC Managed Care – PPO | Admitting: Family Medicine

## 2012-11-30 VITALS — BP 138/96 | HR 100 | Temp 98.8°F | Wt 320.8 lb

## 2012-11-30 DIAGNOSIS — Z23 Encounter for immunization: Secondary | ICD-10-CM

## 2012-11-30 DIAGNOSIS — E1165 Type 2 diabetes mellitus with hyperglycemia: Secondary | ICD-10-CM

## 2012-11-30 DIAGNOSIS — I10 Essential (primary) hypertension: Secondary | ICD-10-CM

## 2012-11-30 DIAGNOSIS — E669 Obesity, unspecified: Secondary | ICD-10-CM

## 2012-11-30 DIAGNOSIS — IMO0001 Reserved for inherently not codable concepts without codable children: Secondary | ICD-10-CM

## 2012-11-30 DIAGNOSIS — E785 Hyperlipidemia, unspecified: Secondary | ICD-10-CM

## 2012-11-30 LAB — BASIC METABOLIC PANEL
Chloride: 104 mEq/L (ref 96–112)
Creatinine, Ser: 1.2 mg/dL (ref 0.4–1.5)
Potassium: 4.2 mEq/L (ref 3.5–5.1)
Sodium: 139 mEq/L (ref 135–145)

## 2012-11-30 LAB — LIPID PANEL
Total CHOL/HDL Ratio: 8
VLDL: 22.4 mg/dL (ref 0.0–40.0)

## 2012-11-30 LAB — MICROALBUMIN / CREATININE URINE RATIO
Microalb Creat Ratio: 2.8 mg/g (ref 0.0–30.0)
Microalb, Ur: 2.5 mg/dL — ABNORMAL HIGH (ref 0.0–1.9)

## 2012-11-30 LAB — LDL CHOLESTEROL, DIRECT: Direct LDL: 173.9 mg/dL

## 2012-11-30 LAB — HEMOGLOBIN A1C: Hgb A1c MFr Bld: 10.1 % — ABNORMAL HIGH (ref 4.6–6.5)

## 2012-11-30 NOTE — Assessment & Plan Note (Signed)
Hronic. Too high diastolics today. Continue ARB/HCTZ - recommended start taking in am - anticipate improved control with this change.

## 2012-11-30 NOTE — Assessment & Plan Note (Signed)
Chronic, stable. Continue meds as up to now. Check blood work today including A1c, BMP and microalb.

## 2012-11-30 NOTE — Progress Notes (Signed)
  Subjective:    Patient ID: Jason Whitney, male    DOB: 02-Aug-1975, 37 y.o.   MRN: [id number]  HPI CC: 1 year f/u, med refill.  HTN - on hyzaar daily, compliant.  No HA, vision changes, CP/tightness, SOB, leg swelling.   HLD - tolerating lipitor well.  Nightly.  DM - compliant with amaryl and janumet.  Running 116-150s 2 hours after meals.  Denies low sugars or hypoglycemic sxs.  Minimal toe paresthesias.  Has not had pneumovax in past.  Will provide today.  Eye exam - about to reschedule because missed prior.  Foot exam today. Lab Results  Component Value Date   HGBA1C 9.6* 12/11/2011   Obesity - No regular exercise.  Some dietary indiscretions.  Past Medical History  Diagnosis Date  . HTN (hypertension)   . HLD (hyperlipidemia)   . Diabetes mellitus type II 2006  . Obesity     Review of Systems Per HPI    Objective:   Physical Exam  Nursing note and vitals reviewed. Constitutional: He appears well-developed and well-nourished. No distress.  obese  HENT:  Head: Normocephalic and atraumatic.  Right Ear: External ear normal.  Left Ear: External ear normal.  Nose: Nose normal.  Mouth/Throat: Oropharynx is clear and moist. No oropharyngeal exudate.  Eyes: Conjunctivae and EOM are normal. Pupils are equal, round, and reactive to light. No scleral icterus.  Neck: Normal range of motion. Neck supple.  Cardiovascular: Normal rate, regular rhythm, normal heart sounds and intact distal pulses.   No murmur heard. Pulmonary/Chest: Effort normal and breath sounds normal. No respiratory distress. He has no wheezes. He has no rales.  Musculoskeletal: He exhibits no edema.  Diabetic foot exam: Normal inspection No skin breakdown No calluses  Normal DP/PT pulses Normal sensation to light touch and monofilament Nails normal  Lymphadenopathy:    He has no cervical adenopathy.  Skin: Skin is warm and dry. No rash noted.  Psychiatric: He has a normal mood and affect.       Assessment & Plan:

## 2012-11-30 NOTE — Patient Instructions (Signed)
Start taking blood pressure medicine in the morning. Blood work today to check sugar and cholesterol levels. Good to see you today, call us with questions. Pneumonia shot today. assuming good blood work results, return to see me in 6 months for diabetes follow up. Schedule eye exam.

## 2012-11-30 NOTE — Addendum Note (Signed)
Addended by: Josph Macho A on: 11/30/2012 11:07 AM   Modules accepted: Orders

## 2012-11-30 NOTE — Assessment & Plan Note (Signed)
Encouraged start regular exercise program. Discussed healthy diet choices.

## 2012-11-30 NOTE — Assessment & Plan Note (Signed)
Tolerating lipitor well. Continue med.

## 2012-12-01 ENCOUNTER — Other Ambulatory Visit: Payer: Self-pay | Admitting: Family Medicine

## 2012-12-01 MED ORDER — GLIMEPIRIDE 4 MG PO TABS: 4.0000 mg | ORAL_TABLET | Freq: Every day | ORAL | Status: DC

## 2013-04-05 ENCOUNTER — Other Ambulatory Visit: Payer: Self-pay | Admitting: *Deleted

## 2013-04-05 MED ORDER — LOSARTAN POTASSIUM-HCTZ 100-25 MG PO TABS: 1.0000 | ORAL_TABLET | Freq: Every day | ORAL | Status: DC

## 2013-04-05 MED ORDER — SITAGLIPTIN PHOS-METFORMIN HCL 50-1000 MG PO TABS: 1.0000 | ORAL_TABLET | Freq: Two times a day (BID) | ORAL | Status: DC

## 2013-04-05 MED ORDER — ATORVASTATIN CALCIUM 20 MG PO TABS: 20.0000 mg | ORAL_TABLET | Freq: Every day | ORAL | Status: DC

## 2013-04-05 MED ORDER — GLIMEPIRIDE 4 MG PO TABS: 4.0000 mg | ORAL_TABLET | Freq: Every day | ORAL | Status: DC

## 2013-04-06 ENCOUNTER — Ambulatory Visit (HOSPITAL_COMMUNITY)
Admission: RE | Admit: 2013-04-06 | Discharge: 2013-04-06 | Disposition: A | Discharge status: Home / Self Care | Payer: BC Managed Care – PPO | Source: Ambulatory Visit | Attending: Family Medicine | Admitting: Family Medicine

## 2013-04-06 ENCOUNTER — Encounter: Payer: Self-pay | Admitting: *Deleted

## 2013-04-06 ENCOUNTER — Ambulatory Visit (INDEPENDENT_AMBULATORY_CARE_PROVIDER_SITE_OTHER): Payer: BC Managed Care – PPO | Admitting: Family Medicine

## 2013-04-06 ENCOUNTER — Encounter: Payer: Self-pay | Admitting: Family Medicine

## 2013-04-06 VITALS — BP 142/90 | HR 76 | Temp 98.6°F | Wt 324.2 lb

## 2013-04-06 DIAGNOSIS — R1909 Other intra-abdominal and pelvic swelling, mass and lump: Secondary | ICD-10-CM | POA: Insufficient documentation

## 2013-04-06 DIAGNOSIS — N508 Other specified disorders of male genital organs: Secondary | ICD-10-CM | POA: Insufficient documentation

## 2013-04-06 DIAGNOSIS — N5089 Other specified disorders of the male genital organs: Secondary | ICD-10-CM

## 2013-04-06 DIAGNOSIS — R21 Rash and other nonspecific skin eruption: Secondary | ICD-10-CM

## 2013-04-06 DIAGNOSIS — E1165 Type 2 diabetes mellitus with hyperglycemia: Secondary | ICD-10-CM

## 2013-04-06 DIAGNOSIS — N492 Inflammatory disorders of scrotum: Secondary | ICD-10-CM

## 2013-04-06 HISTORY — DX: Inflammatory disorders of scrotum: N49.2

## 2013-04-06 LAB — BASIC METABOLIC PANEL
BUN: 10 mg/dL (ref 6–23)
CO2: 24 mEq/L (ref 19–32)
Calcium: 10 mg/dL (ref 8.4–10.5)
Glucose, Bld: 276 mg/dL — ABNORMAL HIGH (ref 70–99)
Potassium: 3.9 mEq/L (ref 3.5–5.3)

## 2013-04-06 LAB — CBC WITH DIFFERENTIAL/PLATELET
Basophils Absolute: 0 10*3/uL (ref 0.0–0.1)
Basophils Relative: 0 % (ref 0–1)
Eosinophils Absolute: 0.1 10*3/uL (ref 0.0–0.7)
HCT: 41.1 % (ref 39.0–52.0)
Hemoglobin: 14.4 g/dL (ref 13.0–17.0)
Lymphs Abs: 3.3 10*3/uL (ref 0.7–4.0)
MCH: 28.7 pg (ref 26.0–34.0)
MCHC: 35 g/dL (ref 30.0–36.0)
Monocytes Absolute: 1.2 10*3/uL — ABNORMAL HIGH (ref 0.1–1.0)
Monocytes Relative: 8 % (ref 3–12)
Neutro Abs: 10.8 10*3/uL — ABNORMAL HIGH (ref 1.7–7.7)
RBC: 5.01 MIL/uL (ref 4.22–5.81)

## 2013-04-06 MED ORDER — DOXYCYCLINE HYCLATE 100 MG PO CAPS: 100.0000 mg | ORAL_CAPSULE | Freq: Two times a day (BID) | ORAL | Status: DC

## 2013-04-06 MED ORDER — PERMETHRIN 5 % EX CREA: 1.0000 "application " | TOPICAL_CREAM | Freq: Once | CUTANEOUS | Status: DC

## 2013-04-06 NOTE — Patient Instructions (Addendum)
For skin rash - possible scabies - treat with permethrin cream sent to pharmacy (place throughout body (not head) at night and then wash off the next morning). For left scrotal lump - I am worried about infection.  I spoke with the urologist who recommends imaging study - I want you to go to [facility] (pass by [place]'s office) to get ultrasound now. I also want you to start using warm compresses. Please return to see me for diabetes follow up.

## 2013-04-06 NOTE — Progress Notes (Signed)
Pre-visit discussion using our clinic review tool. No additional management support is needed unless otherwise documented below in the visit note.  

## 2013-04-06 NOTE — Addendum Note (Signed)
Addended by: Eustaquio Boyden on: 04/06/2013 03:48 PM   Modules accepted: Orders

## 2013-04-06 NOTE — Progress Notes (Signed)
  Subjective:    Patient ID: Jason Whitney, male    DOB: [birth year], 37 y.o.   MRN: [id number]  HPI CC: "i think i may have scabies"  Daughter has been itching for last several weeks, then girlfriend started itching and dx with scabies over the weekend.  Pt started itching 1 week ago - on inside arms and back.   No recent hotel stays, no recent travel. No new lotions, detergents, soaps, shampoos. Pt has been using hydrocortisone cream.  Hot shower worsen itching.  Denies new rash other than scattered bumps on leg and back. No new medicines.  Yesterday when awoke he noticed lump on left scrotum.  Not pruritic.  + sore.  Seems to be getting larger.  Hasn't drained anything.  H/o DM - uncontrolled.  Has not checked sugar in last week.  1 wk ago post prandial sugar 140. Lab Results  Component Value Date   HGBA1C 10.1* 11/30/2012     Past Medical History  Diagnosis Date  . HTN (hypertension)   . HLD (hyperlipidemia)   . Diabetes mellitus type II 2006  . Obesity     Review of Systems Per HPI    Objective:   Physical Exam  Nursing note and vitals reviewed. Constitutional: He appears well-developed and well-nourished. No distress.  Genitourinary: Testes normal and penis normal. Right testis shows no mass, no swelling and no tenderness. Right testis is descended. Left testis shows no mass, no swelling and no tenderness. Left testis is descended. No discharge found.  Left superiolateral scrotal wall separate from left testicle with evident induration with central fluctuance but no significant erythema or discomfort to palpation.  Skin: Skin is warm and dry. Rash noted.  Faint pruritic papular rash dispersed on back and upper extremities, spares wrists and interdigital web spaces       Assessment & Plan:

## 2013-04-06 NOTE — Assessment & Plan Note (Signed)
Pruritic papular rash in setting of recent exposure to scabies - will treat as such with permethrin cream.  Sent to pharmacy. Update if sxs persist or fail to improve.

## 2013-04-06 NOTE — Assessment & Plan Note (Addendum)
Concern for developing abscess/cellulitis of scrotum vs more serious deep tissue infection (less likely).   I spoke with Dr. Patsi Sears urologist on call to discuss case, will obtain STAT imaging study - I discussed with radiology who recommended scrotal US to eval for scrotal abscess. Recommended start treatment with warm compresses. Will ask them to call imaging study results to Dr. Patsi Sears at [facility] or to myself.  If abscess concern, would refer to urology.   Discussed all this with patient, provided with note out of work through tomorrow, with option to extend release if needed.  ADDENDUM ==> Spoke with Dr. Patsi Sears who reviewed [place] - not consistent with abscess.  ?suppurative LAD vs inguinal hernia - rec CT pelvis with contrast to further evaluate this.  I have asked patient to return today for labwork prior to CT scan and then will try and order CT scan for today or tomorrow morning.  Dr. McDiarmid is on call tomorrow for urology.  I recommended pt start doxycycline 10d course.

## 2013-04-06 NOTE — Addendum Note (Signed)
Addended by: Alvina Chou on: 04/06/2013 04:43 PM   Modules accepted: Orders

## 2013-04-06 NOTE — Addendum Note (Signed)
Addended by: Alvina Chou on: 04/06/2013 04:49 PM   Modules accepted: Orders

## 2013-04-07 ENCOUNTER — Ambulatory Visit (INDEPENDENT_AMBULATORY_CARE_PROVIDER_SITE_OTHER)
Admission: RE | Admit: 2013-04-07 | Discharge: 2013-04-07 | Disposition: A | Discharge status: Home / Self Care | Payer: BC Managed Care – PPO | Source: Ambulatory Visit | Attending: Family Medicine | Admitting: Family Medicine

## 2013-04-07 DIAGNOSIS — N508 Other specified disorders of male genital organs: Secondary | ICD-10-CM

## 2013-04-07 DIAGNOSIS — N5089 Other specified disorders of the male genital organs: Secondary | ICD-10-CM

## 2013-04-07 MED ORDER — IOHEXOL 300 MG/ML  SOLN
100.0000 mL | Freq: Once | INTRAMUSCULAR | Status: AC | PRN
  Administered 2013-04-07: 100 mL via INTRAVENOUS

## 2013-04-09 ENCOUNTER — Telehealth: Payer: Self-pay | Admitting: *Deleted

## 2013-04-09 MED ORDER — TRAMADOL HCL 50 MG PO TABS: 50.0000 mg | ORAL_TABLET | Freq: Three times a day (TID) | ORAL | Status: DC | PRN

## 2013-04-09 NOTE — Telephone Encounter (Signed)
Pt states he was seen earlier this week for testicular pain. Pain is tolerable when lying down, but exacerbated by standing/ walking. He has been taking aleve, but it hasn't helped. He request rx for a pain med.

## 2013-04-09 NOTE — Telephone Encounter (Signed)
Clarify this with patient.  If the pain is getting worse, then he needs to be rechecked (and we shouldn't delay that with pain med rx).  Let me know.

## 2013-04-09 NOTE — Telephone Encounter (Signed)
Patient advised. Medication phoned to pharmacy.  

## 2013-04-09 NOTE — Telephone Encounter (Signed)
Patient says the area erupted yesterday morning and a lot of bloody fluid came out.  He says that now it is not bothersome as long as he is lying down but when he gets up to do things around the house it is painful.  He is not sure if he is taking enough Aleve/Ibuprofen/Tylenol.  Has FU visit scheduled on Monday.  Please advise.

## 2013-04-09 NOTE — Telephone Encounter (Signed)
He can take up to 2 aleve twice a day with food (4 pills per day max).  If not better, can take tramadol.   If that doesn't help, then he needs to be seen this weekend.   Please call in tramadol.  Thanks.

## 2013-04-12 ENCOUNTER — Ambulatory Visit (INDEPENDENT_AMBULATORY_CARE_PROVIDER_SITE_OTHER): Payer: BC Managed Care – PPO | Admitting: Family Medicine

## 2013-04-12 ENCOUNTER — Encounter: Payer: Self-pay | Admitting: Family Medicine

## 2013-04-12 VITALS — BP 122/80 | HR 94 | Temp 98.8°F | Ht 71.0 in | Wt 321.5 lb

## 2013-04-12 DIAGNOSIS — N5089 Other specified disorders of the male genital organs: Secondary | ICD-10-CM

## 2013-04-12 DIAGNOSIS — N508 Other specified disorders of male genital organs: Secondary | ICD-10-CM

## 2013-04-12 DIAGNOSIS — E1165 Type 2 diabetes mellitus with hyperglycemia: Secondary | ICD-10-CM

## 2013-04-12 LAB — HEMOGLOBIN A1C: Hgb A1c MFr Bld: 11.9 % — ABNORMAL HIGH (ref 4.6–6.5)

## 2013-04-12 NOTE — Progress Notes (Signed)
Pre-visit discussion using our clinic review tool. No additional management support is needed unless otherwise documented below in the visit note.  

## 2013-04-12 NOTE — Patient Instructions (Addendum)
Continue warm compresses daily as well as continue warm water soaks. Finish doxycycline course. Swelling should slowly go down over next few weeks - let us know if not improving. 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 Remember to take janumet twice daily and glimeperide once daily - return in 3 months for diabetes follow up. Good to see you today - I'm glad it's improving.

## 2013-04-12 NOTE — Assessment & Plan Note (Signed)
Chronic, uncontrolled. Check A1c today. Encouraged compliance with daily meds.

## 2013-04-12 NOTE — Progress Notes (Signed)
   Subjective:    Patient ID: Jason Whitney, male    DOB: 05-08-1976, 37 y.o.   MRN: [id number]  HPI CC: f/u scrotal mass  See prior notes for details.  Concern for developing scrotal cellulitis - treated with doxy after discussion with urologist last week.  Pt presents today for f/u. Has been doing warm water soaks in the tub.  Actually on Thanksgiving morning area drained large amt fluid - since then pain improved. No fevers. Pain is better.  No nausea. Tolerating abx well.  DM - doesn't check sugars regularly.  Last check random sugar 140.  Has missed some doses here and there of glimeperide and mostly of janumet.  Lab Results  Component Value Date   HGBA1C 10.1* 11/30/2012    EXAM:  CT PELVIS WITH CONTRAST  TECHNIQUE:  Multidetector CT imaging of the pelvis was performed using the  standard protocol following the bolus administration of intravenous  contrast.  CONTRAST: OMNIPAQUE IOHEXOL 300 MG/ML SOLN  COMPARISON: Scrotal ultrasound 04/06/2013.  FINDINGS:  In the upper left hemiscrotum there is a 2.7 x 2.4 x 3.1 cm rounded  area surrounded by haziness in the adjacent inguinal fat, indicative  of inflammation. There may be some very subtle early rim enhancement  associated with this region. This is suspicious for an area of  phlegmonous inflammation, possibly early abscess formation. No other  definite masses identified. The testicles are unremarkable  appearance by CT examination.  IMPRESSION:  1. 2.7 x 2.4 x 3.1 cm area of probable phlegmonous inflammation in the upper left hemiscrotum. The possibility of an early abscess is  not excluded. Based on comparison with yesterday's ultrasound examination, the size of this lesion is unchanged.   Past Medical History  Diagnosis Date  . HTN (hypertension)   . HLD (hyperlipidemia)   . Diabetes mellitus type II 2006  . Obesity      Review of Systems Per HPI    Objective:   Physical Exam Left superior scrotal wall  with persistent induration but smaller in size.  No fluctuance.     Assessment & Plan:

## 2013-04-12 NOTE — Assessment & Plan Note (Signed)
Anticipate scrotal cellulitis/abscess improving. Finish doxy course, update if sxs recur.

## 2013-04-29 ENCOUNTER — Telehealth: Payer: Self-pay

## 2013-04-29 MED ORDER — ONETOUCH ULTRA SYSTEM W/DEVICE KIT: 1.0000 | PACK | Freq: Once | Status: DC

## 2013-04-29 MED ORDER — GLUCOSE BLOOD VI STRP: ORAL_STRIP | Status: DC

## 2013-04-29 NOTE — Telephone Encounter (Signed)
Pt request new glucose meter, test strips, to [facility] pharmacy; pt test BS 2-3 times a week; pt does not want to contact insurance co to see what test strips are most cost effective; pt said any kind will do.

## 2013-04-29 NOTE — Telephone Encounter (Signed)
Sent in one touch.  plz notify. Lab Results  Component Value Date   HGBA1C 11.9* 04/12/2013

## 2013-04-30 NOTE — Telephone Encounter (Signed)
Patient notified

## 2013-06-01 ENCOUNTER — Ambulatory Visit (INDEPENDENT_AMBULATORY_CARE_PROVIDER_SITE_OTHER): Payer: BC Managed Care – PPO | Admitting: Family Medicine

## 2013-06-01 ENCOUNTER — Encounter: Payer: Self-pay | Admitting: *Deleted

## 2013-06-01 ENCOUNTER — Encounter: Payer: Self-pay | Admitting: Family Medicine

## 2013-06-01 VITALS — BP 150/90 | HR 97 | Temp 98.6°F | Wt 318.0 lb

## 2013-06-01 DIAGNOSIS — N508 Other specified disorders of male genital organs: Secondary | ICD-10-CM

## 2013-06-01 DIAGNOSIS — N5089 Other specified disorders of the male genital organs: Secondary | ICD-10-CM

## 2013-06-01 MED ORDER — DOXYCYCLINE HYCLATE 100 MG PO CAPS: 100.0000 mg | ORAL_CAPSULE | Freq: Two times a day (BID) | ORAL | Status: DC

## 2013-06-01 NOTE — Progress Notes (Signed)
Subjective:    Patient ID: Jason Whitney, male    DOB: 1976-04-27, 38 y.o.   MRN: [id number]  HPI CC: recurrent groin swelling  Recent scrotal cellulitis/abscess treated with doxycycline 10 day course (04/06/2013).  Area resolved with this.  Sunday night started noticing area swelling again with pain. Denies fevers/chills, malaise, nausea, abd pain.  Has been out of work yesterday and today.  H/o DM - endorses improved control.  Fasting sugar this morning 65, post prandial 141.  To return March for DM f/u labs. Lab Results  Component Value Date   HGBA1C 11.9* 04/12/2013    04/07/2013 EXAM:  CT PELVIS WITH CONTRAST  TECHNIQUE:  Multidetector CT imaging of the pelvis was performed using the  standard protocol following the bolus administration of intravenous  contrast.  CONTRAST: 100mL OMNIPAQUE IOHEXOL 300 MG/ML SOLN  COMPARISON: Scrotal ultrasound 04/06/2013.  FINDINGS:  In the upper left hemiscrotum there is a 2.7 x 2.4 x 3.1 cm rounded  area surrounded by haziness in the adjacent inguinal fat, indicative  of inflammation. There may be some very subtle early rim enhancement  associated with this region. This is suspicious for an area of  phlegmonous inflammation, possibly early abscess formation. No other  definite masses identified. The testicles are unremarkable  appearance by CT examination.  IMPRESSION:  1. 2.7 x 2.4 x 3.1 cm area of probable phlegmonous inflammation in the upper left hemiscrotum. The possibility of an early abscess is  not excluded. Based on comparison with yesterday's ultrasound examination, the size of this lesion is unchanged.    Medications and allergies reviewed and updated in chart.  Past histories reviewed and updated if relevant as below. Patient Active Problem List   Diagnosis Date Noted  . Skin rash 04/06/2013  . Scrotal wall abscess 04/06/2013  . Diabetes mellitus type 2, uncontrolled 04/17/2010  . HYPERLIPIDEMIA 04/17/2010  .  OBESITY 04/17/2010  . HYPERTENSION 04/17/2010   Past Medical History  Diagnosis Date  . HTN (hypertension)   . HLD (hyperlipidemia)   . Diabetes mellitus type II 2006  . Obesity    History reviewed. No pertinent past surgical history. History  Substance Use Topics  . Smoking status: Never Smoker   . Smokeless tobacco: Never Used  . Alcohol Use: Yes     Comment: Occasional   Family History  Problem Relation Age of Onset  . Diabetes Mother   . Diabetes Maternal Grandmother   . Stroke Maternal Grandmother    No Known Allergies Current Outpatient Prescriptions on File Prior to Visit  Medication Sig Dispense Refill  . atorvastatin (LIPITOR) 20 MG tablet Take 1 tablet (20 mg total) by mouth at bedtime.  90 tablet  0  . glimepiride (AMARYL) 4 MG tablet Take 1 tablet (4 mg total) by mouth daily.  90 tablet  0  . glucose blood (ONE TOUCH TEST STRIPS) test strip Check twice daily.  250.02  60 each  12  . losartan-hydrochlorothiazide (HYZAAR) 100-25 MG per tablet Take 1 tablet by mouth daily.  90 tablet  0  . sitaGLIPtin-metformin (JANUMET) 50-1000 MG per tablet Take 1 tablet by mouth 2 (two) times daily with a meal.  180 tablet  0  . traMADol (ULTRAM) 50 MG tablet Take 1 tablet (50 mg total) by mouth every 8 (eight) hours as needed (pain).  20 tablet  0   No current facility-administered medications on file prior to visit.     Review of Systems Per HPI  Objective:   Physical Exam  Nursing note and vitals reviewed. Constitutional: He appears well-developed and well-nourished. No distress.  obese  Genitourinary: Testes normal and penis normal.  Left scrotal wall with ~3-5cm swelling with central induration/fluctuance. Tender to palpation.       Assessment & Plan:

## 2013-06-01 NOTE — Assessment & Plan Note (Signed)
Return of scrotal wall cellulitis/abscess - restart doxy course and warm compresses. Will refer to urology for further evaluation/management.

## 2013-06-01 NOTE — Progress Notes (Signed)
Pre-visit discussion using our clinic review tool. No additional management support is needed unless otherwise documented below in the visit note.  

## 2013-06-01 NOTE — Patient Instructions (Signed)
I think you have recurrence of scrotal infection - pass by Marion's office to try and expedite urology appointment Start doxycycline 10 day course. Use warm compresses daily to scrotum.

## 2013-06-02 ENCOUNTER — Telehealth: Payer: Self-pay | Admitting: Family Medicine

## 2013-06-02 NOTE — Telephone Encounter (Signed)
Pt was seen in office yesterday.  Doxycycline was sent in for scrotal infection.  Pt wants to know if something else is being sent in for the rash around his neck and on his arm that he showed Dr. Sharen HonesGutierrez.  I do not see anything about this in the OV note.

## 2013-06-03 NOTE — Telephone Encounter (Signed)
I recommend resolution of infection first prior to addressing rash.  But may start with eucerin or aveeno moisturizer for rash.

## 2013-06-03 NOTE — Telephone Encounter (Signed)
Patient notified

## 2013-06-04 ENCOUNTER — Ambulatory Visit: Payer: BC Managed Care – PPO | Admitting: Family Medicine

## 2013-07-16 ENCOUNTER — Ambulatory Visit: Payer: BC Managed Care – PPO | Admitting: Family Medicine

## 2013-10-07 ENCOUNTER — Other Ambulatory Visit: Payer: Self-pay

## 2013-10-07 MED ORDER — LOSARTAN POTASSIUM-HCTZ 100-25 MG PO TABS: 1.0000 | ORAL_TABLET | Freq: Every day | ORAL | Status: DC

## 2013-10-07 MED ORDER — SITAGLIPTIN PHOS-METFORMIN HCL 50-1000 MG PO TABS: 1.0000 | ORAL_TABLET | Freq: Two times a day (BID) | ORAL | Status: DC

## 2013-10-07 MED ORDER — GLIMEPIRIDE 4 MG PO TABS: 4.0000 mg | ORAL_TABLET | Freq: Every day | ORAL | Status: DC

## 2013-10-07 NOTE — Telephone Encounter (Signed)
Pt left v/m requesting refill glimepiride, [facility]-HCTZ [facility] 04/12/2013 result note wanted pt to return in 3 months and ? Start insulin. Is OK to refill meds. Pt has appt with Dr Reece Agar already scheduled 11/10/13.

## 2013-11-10 ENCOUNTER — Encounter: Payer: Self-pay | Admitting: Family Medicine

## 2013-11-10 ENCOUNTER — Ambulatory Visit (INDEPENDENT_AMBULATORY_CARE_PROVIDER_SITE_OTHER): Payer: BC Managed Care – PPO | Admitting: Family Medicine

## 2013-11-10 ENCOUNTER — Other Ambulatory Visit: Payer: Self-pay | Admitting: Family Medicine

## 2013-11-10 VITALS — BP 138/88 | HR 68 | Temp 98.3°F | Wt 297.2 lb

## 2013-11-10 DIAGNOSIS — E785 Hyperlipidemia, unspecified: Secondary | ICD-10-CM

## 2013-11-10 DIAGNOSIS — IMO0002 Reserved for concepts with insufficient information to code with codable children: Secondary | ICD-10-CM

## 2013-11-10 DIAGNOSIS — IMO0001 Reserved for inherently not codable concepts without codable children: Secondary | ICD-10-CM

## 2013-11-10 DIAGNOSIS — I10 Essential (primary) hypertension: Secondary | ICD-10-CM

## 2013-11-10 DIAGNOSIS — E1165 Type 2 diabetes mellitus with hyperglycemia: Secondary | ICD-10-CM

## 2013-11-10 LAB — COMPREHENSIVE METABOLIC PANEL
ALK PHOS: 53 U/L (ref 39–117)
ALT: 23 U/L (ref 0–53)
AST: 22 U/L (ref 0–37)
Albumin: 4 g/dL (ref 3.5–5.2)
BILIRUBIN TOTAL: 1.3 mg/dL — AB (ref 0.2–1.2)
BUN: 23 mg/dL (ref 6–23)
CO2: 24 mEq/L (ref 19–32)
Calcium: 9.1 mg/dL (ref 8.4–10.5)
Chloride: 100 mEq/L (ref 96–112)
Creatinine, Ser: 1.2 mg/dL (ref 0.4–1.5)
GFR: 89.11 mL/min (ref >60.00)
Glucose, Bld: 66 mg/dL — ABNORMAL LOW (ref 70–99)
Potassium: 3.5 mEq/L (ref 3.5–5.1)
SODIUM: 137 meq/L (ref 135–145)
TOTAL PROTEIN: 8.2 g/dL (ref 6.0–8.3)

## 2013-11-10 LAB — LIPID PANEL
Cholesterol: 207 mg/dL — ABNORMAL HIGH (ref 0–200)
HDL: 35.2 mg/dL — ABNORMAL LOW (ref >39.00)
LDL CALC: 157 mg/dL — AB (ref 0–99)
NONHDL: 171.8
Total CHOL/HDL Ratio: 6
Triglycerides: 75 mg/dL (ref 0.0–149.0)
VLDL: 15 mg/dL (ref 0.0–40.0)

## 2013-11-10 LAB — HEMOGLOBIN A1C: Hgb A1c MFr Bld: 6.6 % — ABNORMAL HIGH (ref 4.6–6.5)

## 2013-11-10 MED ORDER — GLIMEPIRIDE 2 MG PO TABS: 2.0000 mg | ORAL_TABLET | Freq: Every day | ORAL | Status: DC

## 2013-11-10 NOTE — Progress Notes (Signed)
Pre visit review using our clinic review tool, if applicable. No additional management support is needed unless otherwise documented below in the visit note. 

## 2013-11-10 NOTE — Assessment & Plan Note (Signed)
Chronic, stable. Continue arb/hctz combo pill.

## 2013-11-10 NOTE — Progress Notes (Signed)
BP 138/88  Pulse 68  Temp(Src) 98.3 F (36.8 C) (Oral)  Wt 297 lb 4 oz (134.832 kg)   CC: 6 mo DM f/u  Subjective:    Patient ID: Jason LeveringVondo Furlan, male    DOB: 02-Nov-1975, 38 y.o.   MRN: [id number]  HPI: Jason Whitney is a 38 y.o. male presenting on 11/10/2013 for Follow-up   Comes in today after finishing work shift.  DM - regularly does check sugars twice a week: post prandial 80. This morning fasting 62 - did not feel bad with this.  Compliant with antihyperglycemic regimen which includes: amaryl 4mg  daily and janumet 50/1000 bid.  Denies hypoglycemic symptoms.  Has had some low sugars. Denies paresthesias. Last diabetic eye exam due for this.  Pneumovax: 11/2012.  Prevnar: not indicated currently. Denies any further scrotal infection.  HTN - Compliant with current antihypertensive regimen of hyzaar.  Does not check blood pressures at home.  No low blood pressure readings or symptoms of dizziness/syncope.  Denies HA, vision changes, CP/tightness, SOB, leg swelling.    HLD - not taking lipitor regularly. Forgets at bedtime.  Morbid obesity - 19 lb weight loss noted. Cut back on sodas. Drinking more water. Drinking brisk green tea and lemonade. More active at work. More active at work. Wt Readings from Last 3 Encounters:  11/10/13 297 lb 4 oz (134.832 kg)  06/01/13 318 lb (144.244 kg)  04/12/13 321 lb 8 oz (145.831 kg)   Body mass index is 41.48 kg/(m^2).  Relevant past medical, surgical, family and social history reviewed and updated as indicated.  Allergies and medications reviewed and updated. Current Outpatient Prescriptions on File Prior to Visit  Medication Sig  . atorvastatin (LIPITOR) 20 MG tablet Take 1 tablet (20 mg total) by mouth at bedtime.  Marland Kitchen. glimepiride (AMARYL) 4 MG tablet Take 1 tablet (4 mg total) by mouth daily.  Marland Kitchen. glucose blood (ONE TOUCH TEST STRIPS) test strip Check twice daily.  250.02  . losartan-hydrochlorothiazide (HYZAAR) 100-25 MG per tablet Take 1 tablet  by mouth daily.  . sitaGLIPtin-metformin (JANUMET) 50-1000 MG per tablet Take 1 tablet by mouth 2 (two) times daily with a meal.   No current facility-administered medications on file prior to visit.    Review of Systems Per HPI unless specifically indicated above    Objective:    BP 138/88  Pulse 68  Temp(Src) 98.3 F (36.8 C) (Oral)  Wt 297 lb 4 oz (134.832 kg)  Physical Exam  Nursing note and vitals reviewed. Constitutional: He appears well-developed and well-nourished. No distress.  HENT:  Mouth/Throat: Oropharynx is clear and moist. No oropharyngeal exudate.  Cardiovascular: Normal rate, regular rhythm, normal heart sounds and intact distal pulses.   No murmur heard. Pulmonary/Chest: Effort normal and breath sounds normal. No respiratory distress. He has no wheezes. He has no rales.  Musculoskeletal: He exhibits no edema.  Diabetic foot exam: Normal inspection No skin breakdown No calluses  Normal DP pulses Normal sensation to light touch and monofilament Nails normal   Results for orders placed in visit on 04/12/13  HEMOGLOBIN A1C      Result Value Ref Range   Hemoglobin A1C 11.9 (*) 4.6 - 6.5 %      Assessment & Plan:   Problem List Items Addressed This Visit   Morbid obesity     Interested in DSME, referred today. Congratulated on 19lb weight loss. Encouraged continued sustained weight loss.    HYPERTENSION (Chronic)     Chronic, stable. Continue  arb/hctz combo pill.    HYPERLIPIDEMIA (Chronic)     Not regular with lipitor use. Discussed ok to take in am if he is better able to remember then. Check FLP today.    Relevant Orders      Lipid panel      Comprehensive metabolic panel   Diabetes mellitus type 2, uncontrolled - Primary (Chronic)     Chronic, significantly improved based on recall CBGs. I attribute improvement to more regular use of antihyperglycemics and weight loss. Check A1c today, if improvement will slowly back off sulfonylurea. Pt  agrees with plan. Foot exam today. Encouraged he schedule eye exam.    Relevant Orders      Ambulatory referral to diabetic education      Hemoglobin A1c      Comprehensive metabolic panel      HM DIABETES FOOT EXAM (Completed)       Follow up plan: Return in about 4 months (around 03/13/2014), or as needed, for follow up visit.

## 2013-11-10 NOTE — Assessment & Plan Note (Signed)
Interested in DSME, referred today. Congratulated on 19lb weight loss. Encouraged continued sustained weight loss.

## 2013-11-10 NOTE — Patient Instructions (Signed)
Pass by Marion's office for referral to [facility]. Normal fasting sugar is 80-120. Normal sugar 2 hours after a meal 120-180. Schedule eye doctor appointment. Good to see you, congratulations on weight loss! If blood work today looking good, we will likely back off one of your blood sugar medicines.

## 2013-11-10 NOTE — Assessment & Plan Note (Signed)
Not regular with lipitor use. Discussed ok to take in am if he is better able to remember then. Check FLP today.

## 2013-11-10 NOTE — Assessment & Plan Note (Signed)
Chronic, significantly improved based on recall CBGs. I attribute improvement to more regular use of antihyperglycemics and weight loss. Check A1c today, if improvement will slowly back off sulfonylurea. Pt agrees with plan. Foot exam today. Encouraged he schedule eye exam.

## 2013-11-11 ENCOUNTER — Telehealth: Payer: Self-pay | Admitting: Family Medicine

## 2013-11-11 NOTE — Telephone Encounter (Signed)
Relevant patient education assigned to patient using Emmi. ° °

## 2013-11-13 ENCOUNTER — Encounter: Payer: Self-pay | Admitting: Family Medicine

## 2013-11-18 ENCOUNTER — Telehealth: Payer: Self-pay | Admitting: Family Medicine

## 2013-11-18 NOTE — Telephone Encounter (Signed)
Patient Information:  Caller Name: Jason Whitney  Phone: [phone]  Patient: Jason Whitney, Jason Whitney  Gender: Male  DOB: 1975-08-16  Age: 38 Years  PCP: Carlus PavlovGherghe, Cristina ([facility]  Office Follow Up:  Does the office need to follow up with this patient?: No  Instructions For The Office: N/A  RN Note:  Patient would like to know if he can take OTC medication for the diarrhea. He isn't sure what to take because all of the instructions say if you take medications for diabetes to contact your doctor first. Spoke with Dr. Alwyn RenHopper (on call MD) regarding this request. He recommended stopping the Amaryl, take Immodium A-D, drink clear fluids and eat bland foods. Also instructed to call back on 11/19/13 if symptoms not improving to schedule an appointment. Patient verbalized understanding.  Symptoms  Reason For Call & Symptoms: Diarrhea - approximately 6 episodes within the last 24 hours. Blood sugar 72 this afternoon.  Reviewed Health History In EMR: Yes  Reviewed Medications In EMR: Yes  Reviewed Allergies In EMR: Yes  Reviewed Surgeries / Procedures: Yes  Date of Onset of Symptoms: 11/17/2013  Guideline(s) Used:  Diarrhea  Disposition Per Guideline:   Home Care  Reason For Disposition Reached:   Mild diarrhea  Advice Given:  N/A  Patient Will Follow Care Advice:  YES

## 2013-11-19 NOTE — Telephone Encounter (Signed)
Noted  

## 2014-01-20 ENCOUNTER — Other Ambulatory Visit: Payer: Self-pay

## 2014-01-20 MED ORDER — LOSARTAN POTASSIUM-HCTZ 100-25 MG PO TABS: 1.0000 | ORAL_TABLET | Freq: Every day | ORAL | Status: DC

## 2014-01-20 NOTE — Telephone Encounter (Signed)
Pt [facility]; advised pt done.

## 2014-03-10 ENCOUNTER — Other Ambulatory Visit: Payer: Self-pay | Admitting: *Deleted

## 2014-03-10 ENCOUNTER — Other Ambulatory Visit: Payer: Self-pay | Admitting: Family Medicine

## 2014-03-10 MED ORDER — ATORVASTATIN CALCIUM 20 MG PO TABS: 20.0000 mg | ORAL_TABLET | Freq: Every day | ORAL | Status: DC

## 2014-03-28 ENCOUNTER — Telehealth: Payer: Self-pay

## 2014-03-28 NOTE — Telephone Encounter (Signed)
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 Left v/m advising pt info requested faxed.

## 2014-08-24 ENCOUNTER — Other Ambulatory Visit: Payer: Self-pay | Admitting: *Deleted

## 2014-08-24 MED ORDER — LOSARTAN POTASSIUM-HCTZ 100-25 MG PO TABS: 1.0000 | ORAL_TABLET | Freq: Every day | ORAL | Status: DC

## 2014-08-24 MED ORDER — ATORVASTATIN CALCIUM 20 MG PO TABS: 20.0000 mg | ORAL_TABLET | Freq: Every day | ORAL | Status: DC

## 2014-08-24 MED ORDER — SITAGLIPTIN PHOS-METFORMIN HCL 50-1000 MG PO TABS: 1.0000 | ORAL_TABLET | Freq: Two times a day (BID) | ORAL | Status: DC

## 2014-08-24 MED ORDER — GLIMEPIRIDE 2 MG PO TABS: 2.0000 mg | ORAL_TABLET | Freq: Every day | ORAL | Status: DC

## 2014-10-25 ENCOUNTER — Telehealth: Payer: Self-pay | Admitting: Family Medicine

## 2014-10-25 ENCOUNTER — Encounter: Payer: Self-pay | Admitting: Family Medicine

## 2014-10-25 ENCOUNTER — Ambulatory Visit (INDEPENDENT_AMBULATORY_CARE_PROVIDER_SITE_OTHER): Payer: Managed Care, Other (non HMO) | Admitting: Family Medicine

## 2014-10-25 VITALS — BP 130/94 | HR 72 | Temp 98.2°F | Ht 71.0 in | Wt 322.8 lb

## 2014-10-25 DIAGNOSIS — E119 Type 2 diabetes mellitus without complications: Secondary | ICD-10-CM

## 2014-10-25 DIAGNOSIS — Z6841 Body Mass Index (BMI) 40.0 and over, adult: Secondary | ICD-10-CM

## 2014-10-25 DIAGNOSIS — Z0001 Encounter for general adult medical examination with abnormal findings: Secondary | ICD-10-CM | POA: Insufficient documentation

## 2014-10-25 DIAGNOSIS — Z0000 Encounter for general adult medical examination without abnormal findings: Secondary | ICD-10-CM | POA: Insufficient documentation

## 2014-10-25 DIAGNOSIS — I10 Essential (primary) hypertension: Secondary | ICD-10-CM

## 2014-10-25 DIAGNOSIS — E785 Hyperlipidemia, unspecified: Secondary | ICD-10-CM

## 2014-10-25 MED ORDER — ATORVASTATIN CALCIUM 20 MG PO TABS: 20.0000 mg | ORAL_TABLET | Freq: Every day | ORAL | Status: DC

## 2014-10-25 MED ORDER — LOSARTAN POTASSIUM-HCTZ 100-25 MG PO TABS: 1.0000 | ORAL_TABLET | Freq: Every day | ORAL | Status: DC

## 2014-10-25 MED ORDER — SITAGLIPTIN PHOS-METFORMIN HCL 50-1000 MG PO TABS: 1.0000 | ORAL_TABLET | Freq: Two times a day (BID) | ORAL | Status: DC

## 2014-10-25 MED ORDER — GLIMEPIRIDE 2 MG PO TABS: 2.0000 mg | ORAL_TABLET | Freq: Every day | ORAL | Status: DC

## 2014-10-25 NOTE — Patient Instructions (Addendum)
Diet soda if you're going to drink soda, best is water. Incorporate regular exercise into routine - start walking. Blood work today. meds refilled today. Return as needed or in 6 months for follow up.

## 2014-10-25 NOTE — Assessment & Plan Note (Signed)
Preventative protocols reviewed and updated unless pt declined. Discussed healthy diet and lifestyle.  

## 2014-10-25 NOTE — Progress Notes (Signed)
Pre visit review using our clinic review tool, if applicable. No additional management support is needed unless otherwise documented below in the visit note. 

## 2014-10-25 NOTE — Assessment & Plan Note (Signed)
Reports compliance with 3 drug regimen.  Check A1c today. Foot exam today.

## 2014-10-25 NOTE — Assessment & Plan Note (Addendum)
Forgets lipitor occaisonally. Check FLP today.

## 2014-10-25 NOTE — Assessment & Plan Note (Addendum)
Chronic, mildly elevated. Pt attributes to increased stress.  Continue to monitor.

## 2014-10-25 NOTE — Telephone Encounter (Signed)
Pt brought in paperwork from his work to be filled out for physical. The best number to contact him at is [phone]. Placed on Kim's desk.

## 2014-10-25 NOTE — Assessment & Plan Note (Signed)
Discussed healthy diet and lifestyle changes to affect sustainable weight loss. Discussed bariatric surgery. Pt considering all options.

## 2014-10-25 NOTE — Progress Notes (Signed)
BP 130/94 mmHg  Pulse 72  Temp(Src) 98.2 F (36.8 C) (Oral)  Ht  (1.803 m)  Wt 322 lb 12 oz (146.398 kg)  BMI 45.03 kg/m2   CC: CPE  Subjective:    Patient ID: Jason Whitney, male    DOB: [birth year], 39 y.o.   MRN: [id number]  HPI: Jason Whitney is a 39 y.o. male presenting on 10/25/2014 for Annual Exam   Last seen for DM f/u 11/2013.   Stressed with wife's complicated pregnancy  HTN - complaint with losartan hctz 100/25 daily. DM - compliant with amaryl  daily and janumet 50/1000mg  bid. Checks sugars every few weeks. Last Friday post prandial 114.  HLD - just restarted lipitor  - forgets at bedtime.   Preventative: Flu shot - recommended yearly Tdap 2012 Pneumovax 11/2012 Seat belt use discussed. No changing moles on skin.  Caffeine: little Lives with girlfriend and daughter (2006); no pets  12th grade education  Activity: no regular exercise  Diet: good water, fruits/vegetables daily   Relevant past medical, surgical, family and social history reviewed and updated as indicated. Interim medical history since our last visit reviewed. Allergies and medications reviewed and updated. Current Outpatient Prescriptions on File Prior to Visit  Medication Sig  . glucose blood (ONE TOUCH TEST STRIPS) test strip Check twice daily.  250.02   No current facility-administered medications on file prior to visit.    Review of Systems  Constitutional: Negative for fever, chills, activity change, appetite change, fatigue and unexpected weight change.  HENT: Negative for hearing loss.   Eyes: Negative for visual disturbance.  Respiratory: Negative for cough, chest tightness, shortness of breath and wheezing.   Cardiovascular: Negative for chest pain, palpitations and leg swelling.  Gastrointestinal: Negative for nausea, vomiting, abdominal pain, diarrhea, constipation, blood in stool and abdominal distention.  Genitourinary: Negative for hematuria and difficulty  urinating.  Musculoskeletal: Negative for myalgias, arthralgias and neck pain.  Skin: Negative for rash.  Neurological: Negative for dizziness, seizures, syncope and headaches.  Hematological: Negative for adenopathy. Does not bruise/bleed easily.  Psychiatric/Behavioral: Negative for dysphoric mood. The patient is not nervous/anxious.    Per HPI unless specifically indicated above     Objective:    BP 130/94 mmHg  Pulse 72  Temp(Src) 98.2 F (36.8 C) (Oral)  Ht  (1.803 m)  Wt 322 lb 12 oz (146.398 kg)  BMI 45.03 kg/m2  Wt Readings from Last 3 Encounters:  10/25/14 322 lb 12 oz (146.398 kg)  11/10/13 297 lb 4 oz (134.832 kg)  06/01/13 318 lb (144.244 kg)    Physical Exam  Constitutional: He is oriented to person, place, and time. He appears well-developed and well-nourished. No distress.  HENT:  Head: Normocephalic and atraumatic.  Right Ear: Hearing, tympanic membrane, external ear and ear canal normal.  Left Ear: Hearing, tympanic membrane, external ear and ear canal normal.  Nose: Nose normal.  Mouth/Throat: Uvula is midline, oropharynx is clear and moist and mucous membranes are normal. No oropharyngeal exudate, posterior oropharyngeal edema or posterior oropharyngeal erythema.  Eyes: Conjunctivae and EOM are normal. Pupils are equal, round, and reactive to light. No scleral icterus.  Neck: Normal range of motion. Neck supple. No thyromegaly present.  Cardiovascular: Normal rate, regular rhythm, normal heart sounds and intact distal pulses.   No murmur heard. Pulses:      Radial pulses are 2+ on the right side, and 2+ on the left side.  Pulmonary/Chest: Effort normal and breath sounds normal.  No respiratory distress. He has no wheezes. He has no rales.  Abdominal: Soft. Bowel sounds are normal. He exhibits no distension and no mass. There is no tenderness. There is no rebound and no guarding.  Musculoskeletal: Normal range of motion. He exhibits no edema.  See HPI  for foot exam if done  Lymphadenopathy:    He has no cervical adenopathy.  Neurological: He is alert and oriented to person, place, and time.  CN grossly intact, station and gait intact  Skin: Skin is warm and dry. No rash noted.  Psychiatric: He has a normal mood and affect. His behavior is normal. Judgment and thought content normal.  Nursing note and vitals reviewed.     Assessment & Plan:   Problem List Items Addressed This Visit    Body mass index (BMI) of 45.0-49.9 in adult    Discussed healthy diet and lifestyle changes to affect sustainable weight loss. Discussed bariatric surgery. Pt considering all options.      Diabetes type 2, controlled    Reports compliance with 3 drug regimen.  Check A1c today. Foot exam today.      Relevant Medications   atorvastatin (LIPITOR) 20 MG tablet   glimepiride (AMARYL) 2 MG tablet   losartan-hydrochlorothiazide (HYZAAR) 100-25 MG per tablet   sitaGLIPtin-metformin (JANUMET) 50-1000 MG per tablet   Other Relevant Orders   Hemoglobin A1c   Essential hypertension (Chronic)    Chronic, mildly elevated. Pt attributes to increased stress.  Continue to monitor.      Relevant Medications   atorvastatin (LIPITOR) 20 MG tablet   losartan-hydrochlorothiazide (HYZAAR) 100-25 MG per tablet   Other Relevant Orders   Comprehensive metabolic panel   Health maintenance examination - Primary    Preventative protocols reviewed and updated unless pt declined. Discussed healthy diet and lifestyle.       HLD (hyperlipidemia) (Chronic)    Forgets lipitor occaisonally. Check FLP today.      Relevant Medications   atorvastatin (LIPITOR) 20 MG tablet   losartan-hydrochlorothiazide (HYZAAR) 100-25 MG per tablet   Other Relevant Orders   Lipid panel   Comprehensive metabolic panel       Follow up plan: Return in about 6 months (around 04/26/2015), or as needed, for follow up visit.

## 2014-10-25 NOTE — Telephone Encounter (Signed)
In your IN box 

## 2014-10-26 LAB — LIPID PANEL
Chol/HDL Ratio: 6.3 ratio units — ABNORMAL HIGH (ref 0.0–5.0)
Cholesterol, Total: 203 mg/dL — ABNORMAL HIGH (ref 100–199)
HDL: 32 mg/dL — AB (ref >39)
LDL Calculated: 139 mg/dL — ABNORMAL HIGH (ref 0–99)
Triglycerides: 158 mg/dL — ABNORMAL HIGH (ref 0–149)
VLDL Cholesterol Cal: 32 mg/dL (ref 5–40)

## 2014-10-26 LAB — COMPREHENSIVE METABOLIC PANEL
ALT: 26 IU/L (ref 0–44)
AST: 20 IU/L (ref 0–40)
Albumin/Globulin Ratio: 1.1 (ref 1.1–2.5)
Albumin: 4.2 g/dL (ref 3.5–5.5)
Alkaline Phosphatase: 98 IU/L (ref 39–117)
BUN/Creatinine Ratio: 11 (ref 8–19)
BUN: 12 mg/dL (ref 6–20)
Bilirubin Total: 0.6 mg/dL (ref 0.0–1.2)
CALCIUM: 9.7 mg/dL (ref 8.7–10.2)
CO2: 22 mmol/L (ref 18–29)
Chloride: 96 mmol/L — ABNORMAL LOW (ref 97–108)
Creatinine, Ser: 1.09 mg/dL (ref 0.76–1.27)
GFR calc non Af Amer: 87 mL/min/{1.73_m2} (ref >59)
GFR, EST AFRICAN AMERICAN: 100 mL/min/{1.73_m2} (ref >59)
GLUCOSE: 219 mg/dL — AB (ref 65–99)
Globulin, Total: 3.9 g/dL (ref 1.5–4.5)
Potassium: 4.4 mmol/L (ref 3.5–5.2)
Sodium: 136 mmol/L (ref 134–144)
TOTAL PROTEIN: 8.1 g/dL (ref 6.0–8.5)

## 2014-10-26 LAB — HEMOGLOBIN A1C
Est. average glucose Bld gHb Est-mCnc: 255 mg/dL
Hgb A1c MFr Bld: 10.5 % — ABNORMAL HIGH (ref 4.8–5.6)

## 2014-10-26 NOTE — Telephone Encounter (Signed)
Pt will need to insert waist size. Awaiting lab results then will place in Kim's box.

## 2014-10-27 ENCOUNTER — Other Ambulatory Visit: Payer: Self-pay | Admitting: Family Medicine

## 2014-10-27 MED ORDER — ATORVASTATIN CALCIUM 40 MG PO TABS: 40.0000 mg | ORAL_TABLET | Freq: Every day | ORAL | Status: DC

## 2014-10-27 MED ORDER — GLIMEPIRIDE 4 MG PO TABS: 4.0000 mg | ORAL_TABLET | Freq: Every day | ORAL | Status: DC

## 2014-10-27 NOTE — Telephone Encounter (Signed)
Spoke with patient. Waist is 42in. Form completed and faxed with copy mailed to patient as requested.

## 2014-11-07 ENCOUNTER — Other Ambulatory Visit: Payer: Self-pay

## 2014-11-17 IMAGING — US US SCROTUM
1 series · 13 of 25 positions shown · non-contrast
Comparison: None.

CLINICAL DATA: Left scrotal wall thickening and concern for
abscess. Rule out Fournier gangrene.

EXAM:
ULTRASOUND OF SCROTUM
TECHNIQUE: Complete ultrasound examination of the testicles, epididymis, and
other scrotal structures was performed.

[Series 1: us scrotum · 0.06mm/px · 13 of 36 slices shown]
[im 1/36]
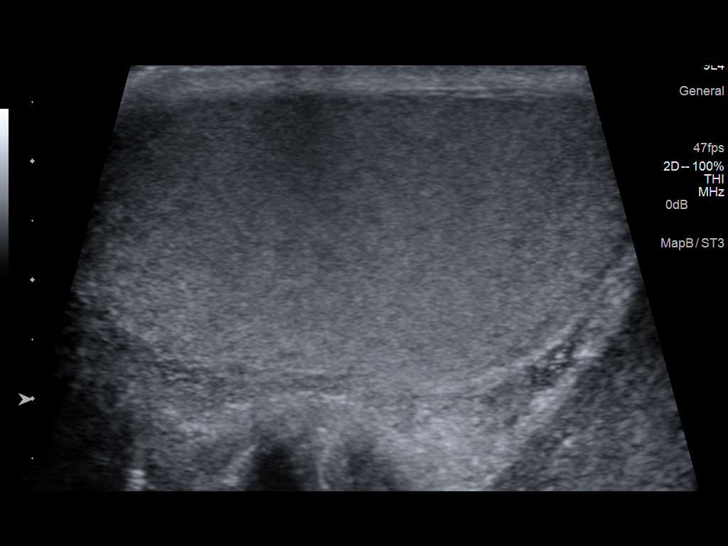
[im 3/36]
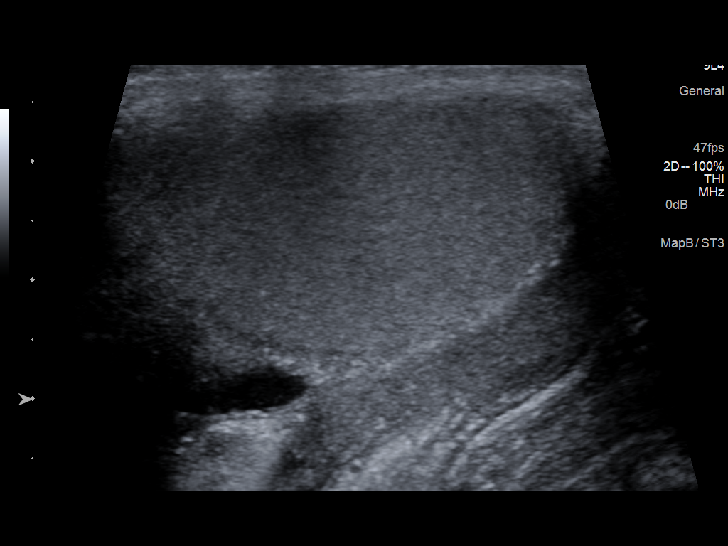
[im 6/36]
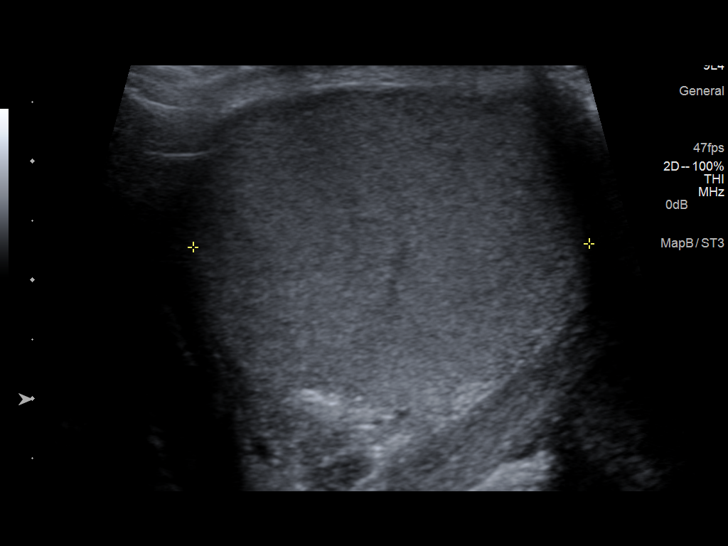
[im 9/36]
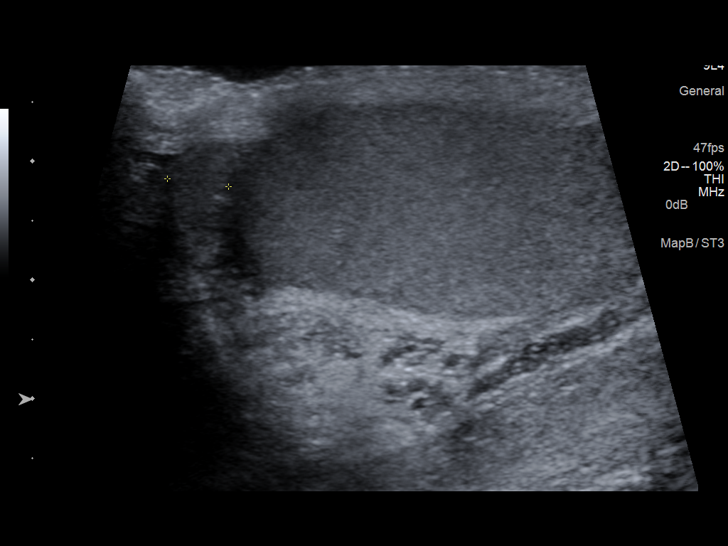
[im 12/36]
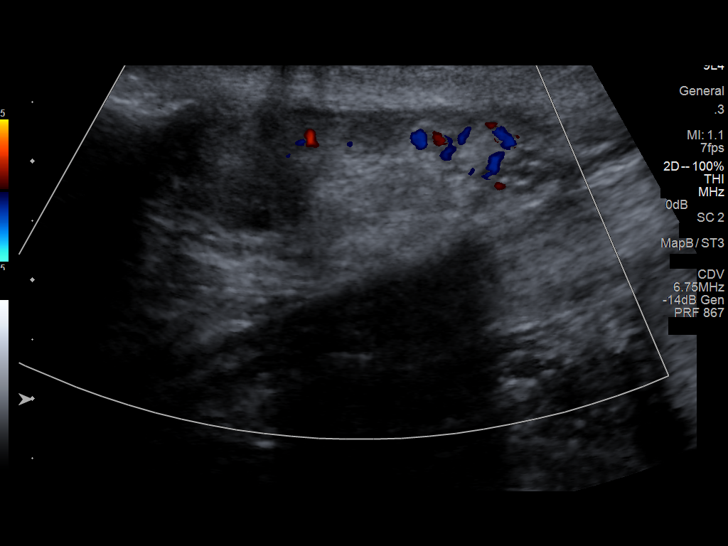
[im 15/36]
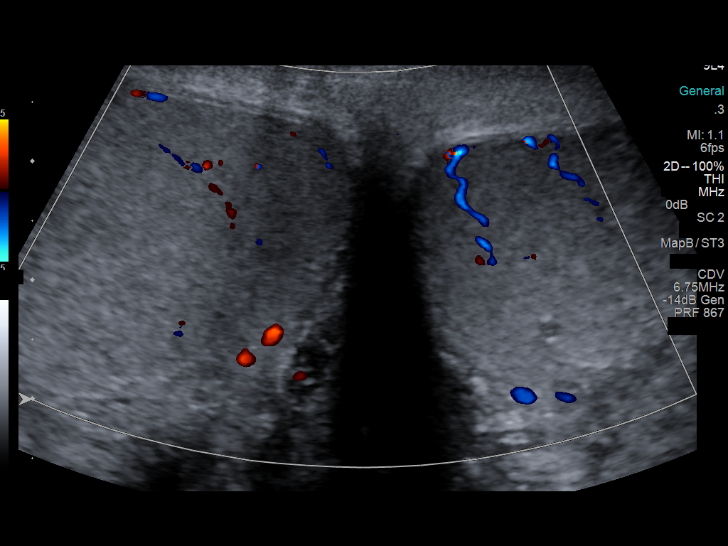
[im 18/36]
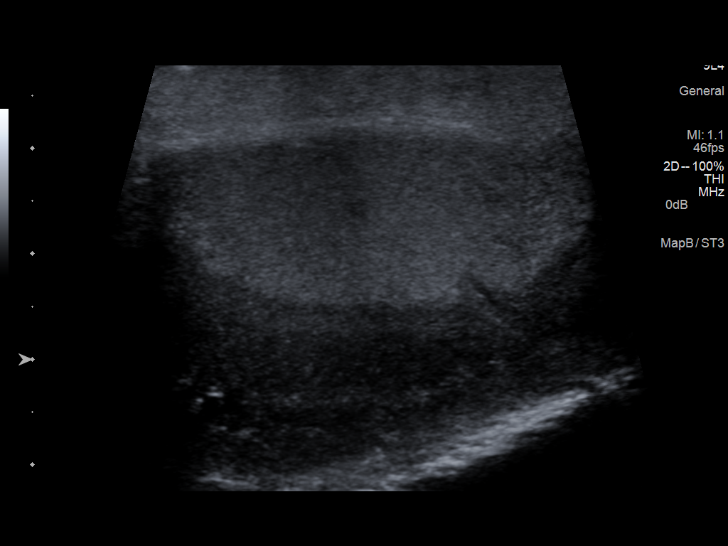
[im 21/36]
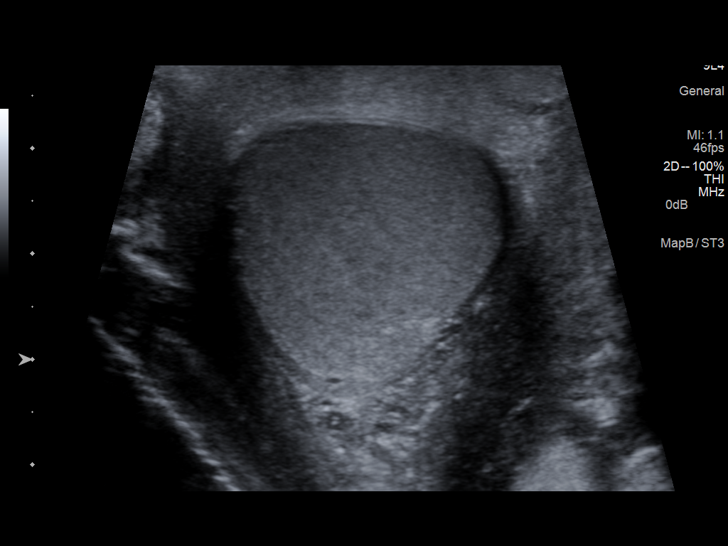
[im 24/36]
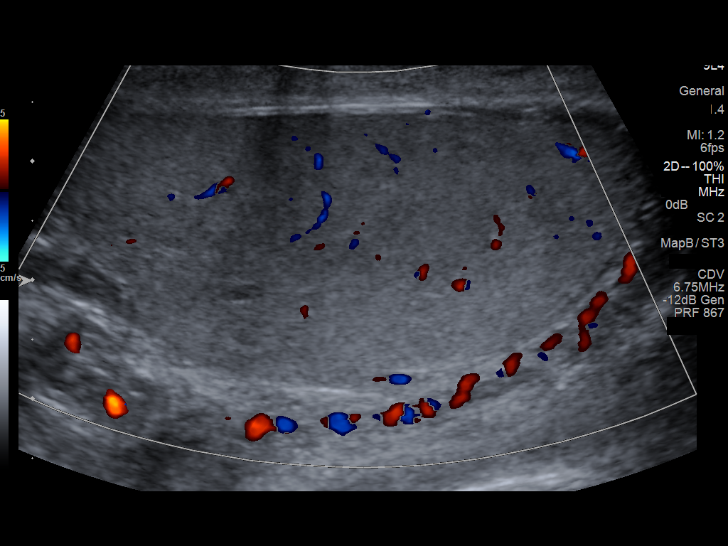
[im 27/36]
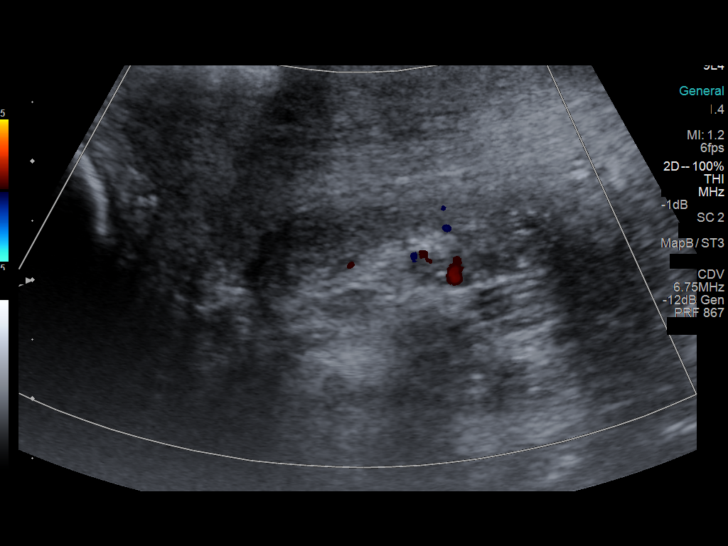
[im 30/36]
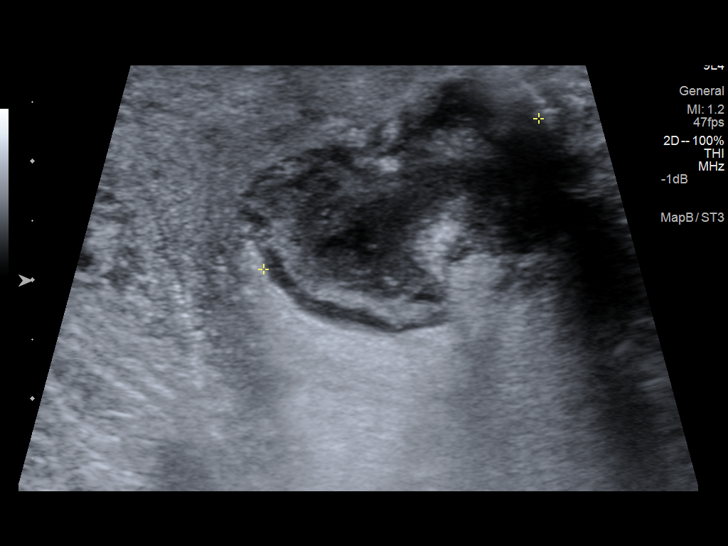
[im 33/36]
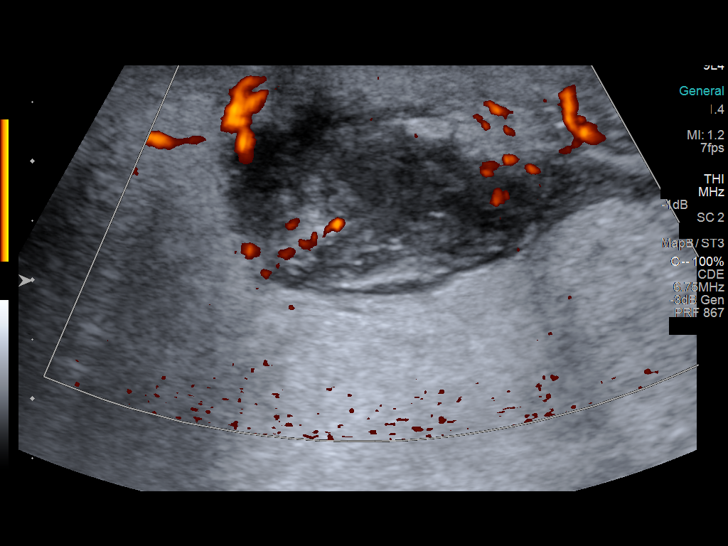
[im 36/36]
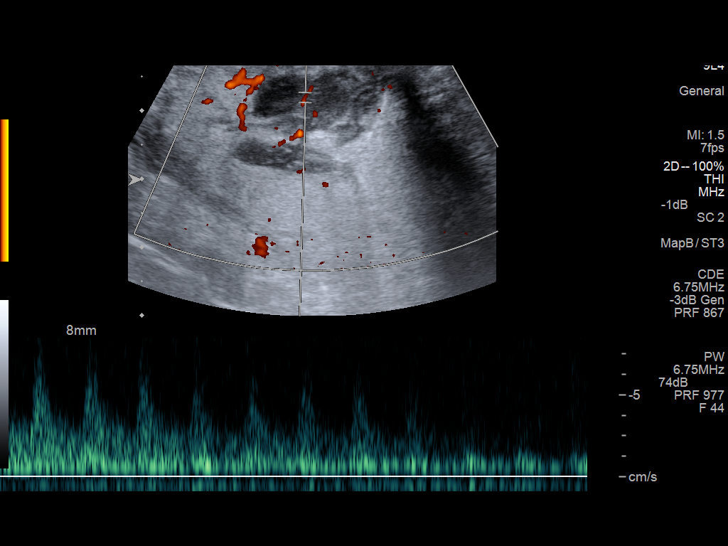

[13 of 25 positions shown; findings below may reference images not displayed]

FINDINGS: Right testicle

Measurements: 4.7 x 2.4 x 3.3 cm. No mass or microlithiasis
visualized.

Left testicle

Measurements: 4.7 x 2.5 x 2.6. No mass or microlithiasis visualized.

Right epididymis:  Normal in size and appearance.

Left epididymis:  Normal in size and appearance.

Hydrocele:  None visualized.

Varicocele:  None visualized.

There is diffuse left scrotal skin thickening without evidence of
scrotal wall fluid collection. Within the left inguinal region is a
complex, predominantly hypoechoic mass measuring 3.0 x 2.3 x 2.6 cm
with some peripheral and scattered central vascularity. This did not
clearly track into the peritoneal cavity or change with Valsalva to
suggest a hernia.
IMPRESSION: 1. Diffuse left scrotal wall thickening without evidence of scrotal
abscess, compatible with cellulitis.
2. Unremarkable appearance of the testes.
3. Complex mass in the left inguinal region. This may represent an
abnormal, markedly enlarged lymph node. Inguinal hernia would be an
additional consideration, although this was not clearly demonstrated
to represent bowel on this ultrasound. Further evaluation with
pelvic CT is recommended.
These results were called by telephone at the time of interpretation
on 04/06/2013 at [DATE] to Dr. Meradieu, who verbally
acknowledged these results.

## 2015-05-01 ENCOUNTER — Ambulatory Visit: Payer: Managed Care, Other (non HMO) | Admitting: Family Medicine

## 2015-10-31 ENCOUNTER — Other Ambulatory Visit: Payer: Self-pay

## 2015-10-31 MED ORDER — LOSARTAN POTASSIUM-HCTZ 100-25 MG PO TABS: 1.0000 | ORAL_TABLET | Freq: Every day | ORAL | Status: DC

## 2015-10-31 MED ORDER — SITAGLIPTIN PHOS-METFORMIN HCL 50-1000 MG PO TABS: 1.0000 | ORAL_TABLET | Freq: Two times a day (BID) | ORAL | Status: DC

## 2015-10-31 MED ORDER — GLIMEPIRIDE 4 MG PO TABS: 4.0000 mg | ORAL_TABLET | Freq: Every day | ORAL | Status: DC

## 2015-10-31 NOTE — Telephone Encounter (Signed)
Pt has changed mail order pharmacies and request refill glimepiride,losartan-HCTZ [facility] already [facility]). Advised will refill x 1 and pt needs to schedule annual. Pt will ck his schedule and cb to make appt.

## 2016-01-26 ENCOUNTER — Telehealth: Payer: Self-pay | Admitting: Family Medicine

## 2016-01-26 NOTE — Telephone Encounter (Signed)
Scheduled 9/25 at 11am. Left vm to call back and get appt info

## 2016-01-26 NOTE — Telephone Encounter (Signed)
could schedule 25th at 11am. Thanks.

## 2016-01-26 NOTE — Telephone Encounter (Signed)
Pt needs cpe + labs before 9/30 for insurance. Dr Nechama GuardG-you do not have any cpe slots before then. Is it ok to schedule cpe with NP?

## 2016-01-29 NOTE — Telephone Encounter (Signed)
Pt aware of appt.

## 2016-02-05 ENCOUNTER — Encounter: Payer: Self-pay | Admitting: Family Medicine

## 2016-02-05 ENCOUNTER — Ambulatory Visit (INDEPENDENT_AMBULATORY_CARE_PROVIDER_SITE_OTHER): Payer: 59 | Admitting: Family Medicine

## 2016-02-05 VITALS — BP 130/90 | HR 88 | Temp 98.8°F | Ht 71.0 in | Wt 318.0 lb

## 2016-02-05 DIAGNOSIS — I10 Essential (primary) hypertension: Secondary | ICD-10-CM | POA: Diagnosis not present

## 2016-02-05 DIAGNOSIS — Z0000 Encounter for general adult medical examination without abnormal findings: Secondary | ICD-10-CM

## 2016-02-05 DIAGNOSIS — E119 Type 2 diabetes mellitus without complications: Secondary | ICD-10-CM

## 2016-02-05 DIAGNOSIS — Z23 Encounter for immunization: Secondary | ICD-10-CM | POA: Diagnosis not present

## 2016-02-05 DIAGNOSIS — E785 Hyperlipidemia, unspecified: Secondary | ICD-10-CM | POA: Diagnosis not present

## 2016-02-05 DIAGNOSIS — R112 Nausea with vomiting, unspecified: Secondary | ICD-10-CM

## 2016-02-05 MED ORDER — GLIMEPIRIDE 4 MG PO TABS: 4.0000 mg | ORAL_TABLET | Freq: Every day | ORAL | 3 refills | Status: DC

## 2016-02-05 MED ORDER — SITAGLIPTIN PHOS-METFORMIN HCL 50-1000 MG PO TABS: 1.0000 | ORAL_TABLET | Freq: Two times a day (BID) | ORAL | 3 refills | Status: DC

## 2016-02-05 MED ORDER — LOSARTAN POTASSIUM-HCTZ 100-25 MG PO TABS: 1.0000 | ORAL_TABLET | Freq: Every day | ORAL | 3 refills | Status: DC

## 2016-02-05 MED ORDER — ATORVASTATIN CALCIUM 40 MG PO TABS: 40.0000 mg | ORAL_TABLET | Freq: Every day | ORAL | 3 refills | Status: DC

## 2016-02-05 NOTE — Assessment & Plan Note (Signed)
Discussed healthy diet and lifestyle changes to affect sustainable weight loss  

## 2016-02-05 NOTE — Assessment & Plan Note (Signed)
Preventative protocols reviewed and updated unless pt declined. Discussed healthy diet and lifestyle.  

## 2016-02-05 NOTE — Progress Notes (Signed)
BP 130/90   Pulse 88   Temp 98.8 F (37.1 C) (Oral)   Ht 5\' 11"  (1.803 m)   Wt (!) 318 lb (144.2 kg)   BMI 44.35 kg/m    CC: CPE Subjective:    Patient ID: Jason Whitney, male    DOB: 08/16/1975, 40 y.o.   MRN: [id number]  HPI: Jason Whitney is a 40 y.o. male presenting on 02/05/2016 for Annual Exam (discuss once a week insulin)   Several recent DNKAs. Last seen 10/2014 for CPE.   HTN - complaint with losartan hctz 100/25 daily. DM - compliant with amaryl 2mg  daily and janumet 50/1000mg  bid. Checks sugars every few weeks. Fasting this morning 108. Overdue for eye exam.  HLD - complaint with lipitor 20mg  without myalgias   Diabetic Foot Exam - Simple   Simple Foot Form Diabetic Foot exam was performed with the following findings:  Yes 02/05/2016 11:44 AM  Visual Inspection No deformities, no ulcerations, no other skin breakdown bilaterally:  Yes Sensation Testing Intact to touch and monofilament testing bilaterally:  Yes Pulse Check Posterior Tibialis and Dorsalis pulse intact bilaterally:  Yes Comments      Preventative: Flu shot - today Pneumovax 11/2012 Tdap 2012  Seat belt use discussed. Sunscreen use discussed. No changing moles on skin. Non smoker Alcohol - rare  Caffeine: little Lives with girlfriend and daughter (2006); no pets  12th grade education  Activity: no regular exercise  Diet: good water, fruits/vegetables daily   Relevant past medical, surgical, family and social history reviewed and updated as indicated. Interim medical history since our last visit reviewed. Allergies and medications reviewed and updated. Current Outpatient Prescriptions on File Prior to Visit  Medication Sig  . atorvastatin (LIPITOR) 40 MG tablet Take 1 tablet (40 mg total) by mouth at bedtime.  Marland Kitchen glimepiride (AMARYL) 4 MG tablet Take 1 tablet (4 mg total) by mouth daily.  Marland Kitchen glucose blood (ONE TOUCH TEST STRIPS) test strip Check twice daily.  250.02  .  losartan-hydrochlorothiazide (HYZAAR) 100-25 MG tablet Take 1 tablet by mouth daily.  . sitaGLIPtin-metformin (JANUMET) 50-1000 MG tablet Take 1 tablet by mouth 2 (two) times daily with a meal.   No current facility-administered medications on file prior to visit.     Review of Systems  Constitutional: Negative for activity change, appetite change, chills, fatigue, fever and unexpected weight change.  HENT: Negative for hearing loss.   Eyes: Negative for visual disturbance.  Respiratory: Negative for cough, chest tightness, shortness of breath and wheezing.   Cardiovascular: Negative for chest pain, palpitations and leg swelling.  Gastrointestinal: Positive for diarrhea (intermittent episodes) and vomiting (intermittent episodes every few months). Negative for abdominal distention, abdominal pain, blood in stool, constipation and nausea.  Genitourinary: Negative for difficulty urinating and hematuria.  Musculoskeletal: Negative for arthralgias, myalgias and neck pain.  Skin: Negative for rash.  Neurological: Negative for dizziness, seizures, syncope and headaches.  Hematological: Negative for adenopathy. Does not bruise/bleed easily.  Psychiatric/Behavioral: Negative for dysphoric mood. The patient is not nervous/anxious.    Per HPI unless specifically indicated in ROS section     Objective:    BP 130/90   Pulse 88   Temp 98.8 F (37.1 C) (Oral)   Ht 5\' 11"  (1.803 m)   Wt (!) 318 lb (144.2 kg)   BMI 44.35 kg/m   Wt Readings from Last 3 Encounters:  02/05/16 (!) 318 lb (144.2 kg)  10/25/14 (!) 322 lb 12 oz (146.4 kg)  11/10/13  297 lb 4 oz (134.8 kg)    Physical Exam  Constitutional: He is oriented to person, place, and time. He appears well-developed and well-nourished. No distress.  HENT:  Head: Normocephalic and atraumatic.  Right Ear: Hearing, tympanic membrane, external ear and ear canal normal.  Left Ear: Hearing, tympanic membrane, external ear and ear canal normal.    Nose: Nose normal.  Mouth/Throat: Uvula is midline, oropharynx is clear and moist and mucous membranes are normal. No oropharyngeal exudate, posterior oropharyngeal edema or posterior oropharyngeal erythema.  Eyes: Conjunctivae and EOM are normal. Pupils are equal, round, and reactive to light. No scleral icterus.  Neck: Normal range of motion. Neck supple. No thyromegaly present.  Cardiovascular: Normal rate, regular rhythm, normal heart sounds and intact distal pulses.   No murmur heard. Pulses:      Radial pulses are 2+ on the right side, and 2+ on the left side.  Pulmonary/Chest: Effort normal and breath sounds normal. No respiratory distress. He has no wheezes. He has no rales.  Abdominal: Soft. Bowel sounds are normal. He exhibits no distension and no mass. There is no tenderness. There is no rebound and no guarding.  Musculoskeletal: Normal range of motion. He exhibits no edema.  See HPI for foot exam if done  Lymphadenopathy:    He has no cervical adenopathy.  Neurological: He is alert and oriented to person, place, and time.  CN grossly intact, station and gait intact  Skin: Skin is warm and dry. No rash noted.  Psychiatric: He has a normal mood and affect. His behavior is normal. Judgment and thought content normal.  Nursing note and vitals reviewed.  Results for orders placed or performed in visit on 10/25/14  Lipid panel  Result Value Ref Range   Cholesterol, Total 203 (H) 100 - 199 mg/dL   Triglycerides 161158 (H) 0 - 149 mg/dL   HDL 32 (L) >09>39 mg/dL   VLDL Cholesterol Cal 32 5 - 40 mg/dL   LDL Calculated 604139 (H) 0 - 99 mg/dL   Chol/HDL Ratio 6.3 (H) 0.0 - 5.0 ratio units  Comprehensive metabolic panel  Result Value Ref Range   Glucose 219 (H) 65 - 99 mg/dL   BUN 12 6 - 20 mg/dL   Creatinine, Ser 5.401.09 0.76 - 1.27 mg/dL   GFR calc non Af Amer 87 >59 mL/min/1.73   GFR calc Af Amer 100 >59 mL/min/1.73   BUN/Creatinine Ratio 11 8 - 19   Sodium 136 134 - 144 mmol/L    Potassium 4.4 3.5 - 5.2 mmol/L   Chloride 96 (L) 97 - 108 mmol/L   CO2 22 18 - 29 mmol/L   Calcium 9.7 8.7 - 10.2 mg/dL   Total Protein 8.1 6.0 - 8.5 g/dL   Albumin 4.2 3.5 - 5.5 g/dL   Globulin, Total 3.9 1.5 - 4.5 g/dL   Albumin/Globulin Ratio 1.1 1.1 - 2.5   Bilirubin Total 0.6 0.0 - 1.2 mg/dL   Alkaline Phosphatase 98 39 - 117 IU/L   AST 20 0 - 40 IU/L   ALT 26 0 - 44 IU/L  Hemoglobin A1c  Result Value Ref Range   Hgb A1c MFr Bld 10.5 (H) 4.8 - 5.6 %   Est. average glucose Bld gHb Est-mCnc 255 mg/dL      Assessment & Plan:   Problem List Items Addressed This Visit    Diabetes type 2, controlled (HCC)    Overdue for f/u. Labs and foot exam today. Asks about trulicity -  coupon and pamphlet provided. Advised pt return in 3 months for f/u.       Relevant Orders   Hemoglobin A1c   Essential hypertension (Chronic)    Chronic, stable. Continue current regimen.       Health maintenance examination - Primary    Preventative protocols reviewed and updated unless pt declined. Discussed healthy diet and lifestyle.       HLD (hyperlipidemia) (Chronic)    Chronic, stable. Continue lipitor. FLP today.       Relevant Orders   Lipid panel   Comprehensive metabolic panel   Severe obesity (BMI >= 40) (HCC)    Discussed healthy diet and lifestyle changes to affect sustainable weight loss.        Other Visit Diagnoses    Need for influenza vaccination       Relevant Orders   Flu Vaccine QUAD 36+ mos PF IM (Fluarix & Fluzone Quad PF)       Follow up plan: Return in about 3 months (around 05/06/2016) for follow up visit.  Eustaquio Boyden, MD

## 2016-02-05 NOTE — Progress Notes (Signed)
Pre visit review using our clinic review tool, if applicable. No additional management support is needed unless otherwise documented below in the visit note. 

## 2016-02-05 NOTE — Assessment & Plan Note (Signed)
Chronic, stable. Continue current regimen. 

## 2016-02-05 NOTE — Assessment & Plan Note (Signed)
Overdue for f/u. Labs and foot exam today. Asks about trulicity - coupon and pamphlet provided. Advised pt return in 3 months for f/u.

## 2016-02-05 NOTE — Assessment & Plan Note (Signed)
Chronic, stable. Continue lipitor. FLP today.

## 2016-02-05 NOTE — Patient Instructions (Addendum)
Flu shot today Labs today Schedule eye exam as you're due.  We could consider trulicity after labs return.  Return in 3 months for diabetes follow up vist  Health Maintenance, Male A healthy lifestyle and preventative care can promote health and wellness.  Maintain regular health, dental, and eye exams.  Eat a healthy diet. Foods like vegetables, fruits, whole grains, low-fat dairy products, and lean protein foods contain the nutrients you need and are low in calories. Decrease your intake of foods high in solid fats, added sugars, and salt. Get information about a proper diet from your health care provider, if necessary.  Regular physical exercise is one of the most important things you can do for your health. Most adults should get at least 150 minutes of moderate-intensity exercise (any activity that increases your heart rate and causes you to sweat) each week. In addition, most adults need muscle-strengthening exercises on 2 or more days a week.   Maintain a healthy weight. The body mass index (BMI) is a screening tool to identify possible weight problems. It provides an estimate of body fat based on height and weight. Your health care provider can find your BMI and can help you achieve or maintain a healthy weight. For males 20 years and older:  A BMI below 18.5 is considered underweight.  A BMI of 18.5 to 24.9 is normal.  A BMI of 25 to 29.9 is considered overweight.  A BMI of 30 and above is considered obese.  Maintain normal blood lipids and cholesterol by exercising and minimizing your intake of saturated fat. Eat a balanced diet with plenty of fruits and vegetables. Blood tests for lipids and cholesterol should begin at age 38 and be repeated every 5 years. If your lipid or cholesterol levels are high, you are over age 38, or you are at high risk for heart disease, you may need your cholesterol levels checked more frequently.Ongoing high lipid and cholesterol levels should be  treated with medicines if diet and exercise are not working.  If you smoke, find out from your health care provider how to quit. If you do not use tobacco, do not start.  Lung cancer screening is recommended for adults aged 55-80 years who are at high risk for developing lung cancer because of a history of smoking. A yearly low-dose CT scan of the lungs is recommended for people who have at least a 30-pack-year history of smoking and are current smokers or have quit within the past 15 years. A pack year of smoking is smoking an average of 1 pack of cigarettes a day for 1 year (for example, a 30-pack-year history of smoking could mean smoking 1 pack a day for 30 years or 2 packs a day for 15 years). Yearly screening should continue until the smoker has stopped smoking for at least 15 years. Yearly screening should be stopped for people who develop a health problem that would prevent them from having lung cancer treatment.  If you choose to drink alcohol, do not have more than 2 drinks per day. One drink is considered to be 12 oz (360 mL) of beer, 5 oz (150 mL) of wine, or 1.5 oz (45 mL) of liquor.  Avoid the use of street drugs. Do not share needles with anyone. Ask for help if you need support or instructions about stopping the use of drugs.  High blood pressure causes heart disease and increases the risk of stroke. High blood pressure is more likely to develop in:  People who have blood pressure in the end of the normal range (100-139/85-89 mm Hg).  People who are overweight or obese.  People who are African American.  If you are 89-23 years of age, have your blood pressure checked every 3-5 years. If you are 33 years of age or older, have your blood pressure checked every year. You should have your blood pressure measured twice--once when you are at a hospital or clinic, and once when you are not at a hospital or clinic. Record the average of the two measurements. To check your blood pressure  when you are not at a hospital or clinic, you can use:  An automated blood pressure machine at a pharmacy.  A home blood pressure monitor.  If you are 66-39 years old, ask your health care provider if you should take aspirin to prevent heart disease.  Diabetes screening involves taking a blood sample to check your fasting blood sugar level. This should be done once every 3 years after age 72 if you are at a normal weight and without risk factors for diabetes. Testing should be considered at a younger age or be carried out more frequently if you are overweight and have at least 1 risk factor for diabetes.  Colorectal cancer can be detected and often prevented. Most routine colorectal cancer screening begins at the age of 22 and continues through age 32. However, your health care provider may recommend screening at an earlier age if you have risk factors for colon cancer. On a yearly basis, your health care provider may provide home test kits to check for hidden blood in the stool. A small camera at the end of a tube may be used to directly examine the colon (sigmoidoscopy or colonoscopy) to detect the earliest forms of colorectal cancer. Talk to your health care provider about this at age 47 when routine screening begins. A direct exam of the colon should be repeated every 5-10 years through age 47, unless early forms of precancerous polyps or small growths are found.  People who are at an increased risk for hepatitis B should be screened for this virus. You are considered at high risk for hepatitis B if:  You were born in a country where hepatitis B occurs often. Talk with your health care provider about which countries are considered high risk.  Your parents were born in a high-risk country and you have not received a shot to protect against hepatitis B (hepatitis B vaccine).  You have HIV or AIDS.  You use needles to inject street drugs.  You live with, or have sex with, someone who has  hepatitis B.  You are a man who has sex with other men (MSM).  You get hemodialysis treatment.  You take certain medicines for conditions like cancer, organ transplantation, and autoimmune conditions.  Hepatitis C blood testing is recommended for all people born from [birth year] through 1963 and any individual with known risk factors for hepatitis C.  Healthy men should no longer receive prostate-specific antigen (PSA) blood tests as part of routine cancer screening. Talk to your health care provider about prostate cancer screening.  Testicular cancer screening is not recommended for adolescents or adult males who have no symptoms. Screening includes self-exam, a health care provider exam, and other screening tests. Consult with your health care provider about any symptoms you have or any concerns you have about testicular cancer.  Practice safe sex. Use condoms and avoid high-risk sexual practices to reduce the spread of  sexually transmitted infections (STIs).  You should be screened for STIs, including gonorrhea and chlamydia if:  You are sexually active and are younger than 24 years.  You are older than 24 years, and your health care provider tells you that you are at risk for this type of infection.  Your sexual activity has changed since you were last screened, and you are at an increased risk for chlamydia or gonorrhea. Ask your health care provider if you are at risk.  If you are at risk of being infected with HIV, it is recommended that you take a prescription medicine daily to prevent HIV infection. This is called pre-exposure prophylaxis (PrEP). You are considered at risk if:  You are a man who has sex with other men (MSM).  You are a heterosexual man who is sexually active with multiple partners.  You take drugs by injection.  You are sexually active with a partner who has HIV.  Talk with your health care provider about whether you are at high risk of being infected with HIV. If  you choose to begin PrEP, you should first be tested for HIV. You should then be tested every 3 months for as long as you are taking PrEP.  Use sunscreen. Apply sunscreen liberally and repeatedly throughout the day. You should seek shade when your shadow is shorter than you. Protect yourself by wearing long sleeves, pants, a wide-brimmed hat, and sunglasses year round whenever you are outdoors.  Tell your health care provider of new moles or changes in moles, especially if there is a change in shape or color. Also, tell your health care provider if a mole is larger than the size of a pencil eraser.  A one-time screening for abdominal aortic aneurysm (AAA) and surgical repair of large AAAs by ultrasound is recommended for men aged 50-75 years who are current or former smokers.  Stay current with your vaccines (immunizations).   This information is not intended to replace advice given to you by your health care provider. Make sure you discuss any questions you have with your health care provider.   Document Released: 10/26/2007 Document Revised: 05/20/2014 Document Reviewed: 09/24/2010 Elsevier Interactive Patient Education [employer].

## 2016-02-06 LAB — LIPID PANEL
CHOL/HDL RATIO: 7.8 ratio — AB (ref 0.0–5.0)
Cholesterol, Total: 225 mg/dL — ABNORMAL HIGH (ref 100–199)
HDL: 29 mg/dL — AB (ref >39)
LDL Calculated: 175 mg/dL — ABNORMAL HIGH (ref 0–99)
Triglycerides: 106 mg/dL (ref 0–149)
VLDL Cholesterol Cal: 21 mg/dL (ref 5–40)

## 2016-02-06 LAB — COMPREHENSIVE METABOLIC PANEL
ALT: 37 IU/L (ref 0–44)
AST: 26 IU/L (ref 0–40)
Albumin/Globulin Ratio: 1.1 — ABNORMAL LOW (ref 1.2–2.2)
Albumin: 4.1 g/dL (ref 3.5–5.5)
Alkaline Phosphatase: 79 IU/L (ref 39–117)
BILIRUBIN TOTAL: 0.5 mg/dL (ref 0.0–1.2)
BUN/Creatinine Ratio: 16 (ref 9–20)
BUN: 19 mg/dL (ref 6–20)
CALCIUM: 9.5 mg/dL (ref 8.7–10.2)
CHLORIDE: 99 mmol/L (ref 96–106)
CO2: 25 mmol/L (ref 18–29)
Creatinine, Ser: 1.2 mg/dL (ref 0.76–1.27)
GFR calc non Af Amer: 77 mL/min/{1.73_m2} (ref >59)
GFR, EST AFRICAN AMERICAN: 89 mL/min/{1.73_m2} (ref >59)
GLUCOSE: 131 mg/dL — AB (ref 65–99)
Globulin, Total: 3.9 g/dL (ref 1.5–4.5)
Potassium: 4.1 mmol/L (ref 3.5–5.2)
Sodium: 139 mmol/L (ref 134–144)
TOTAL PROTEIN: 8 g/dL (ref 6.0–8.5)

## 2016-02-06 LAB — LIPASE: LIPASE: 47 U/L (ref 0–59)

## 2016-02-06 LAB — HEMOGLOBIN A1C
ESTIMATED AVERAGE GLUCOSE: 226 mg/dL
Hgb A1c MFr Bld: 9.5 % — ABNORMAL HIGH (ref 4.8–5.6)

## 2016-05-10 ENCOUNTER — Ambulatory Visit: Payer: 59 | Admitting: Family Medicine

## 2016-06-14 ENCOUNTER — Ambulatory Visit: Payer: 59 | Admitting: Family Medicine

## 2016-07-20 ENCOUNTER — Encounter: Payer: Self-pay | Admitting: Family

## 2016-07-20 ENCOUNTER — Ambulatory Visit (INDEPENDENT_AMBULATORY_CARE_PROVIDER_SITE_OTHER): Payer: 59 | Admitting: Family

## 2016-07-20 VITALS — BP 138/90 | HR 95 | Temp 98.1°F | Ht 71.0 in | Wt 314.0 lb

## 2016-07-20 DIAGNOSIS — L0291 Cutaneous abscess, unspecified: Secondary | ICD-10-CM

## 2016-07-20 MED ORDER — CEPHALEXIN 500 MG PO CAPS: 500.0000 mg | ORAL_CAPSULE | Freq: Four times a day (QID) | ORAL | 0 refills | Status: DC

## 2016-07-20 NOTE — Patient Instructions (Signed)
Warm compresses  Start antibiotic  Take probiotics with it  Let us know if not better  Let us know if BP running higher than 140/90.

## 2016-07-20 NOTE — Progress Notes (Signed)
Subjective:    Patient ID: Jason Whitney, male    DOB: 10/15/1975, 41 y.o.   MRN: [id number]  CC: Jason Whitney is a 41 y.o. male who presents today for an acute visit.    HPI: CC: boil on left buttocks x 8 days, worsening. Describes as warm, tender. No drainage.   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   No fever  No h/o mrsa; had boil in past in groin.   HTN- elevated today. Took hyzarr 15 minutes ago and 'hasn't hit yet.'  Denies exertional chest pain or pressure, numbness or tingling radiating to left arm or jaw, palpitations, dizziness, frequent headaches, changes in vision, or shortness of breath.       HISTORY:  Past Medical History:  Diagnosis Date  . Diabetes mellitus type II 2006  . HLD (hyperlipidemia)   . HTN (hypertension)   . Obesity    No past surgical history on file. Family History  Problem Relation Age of Onset  . Diabetes Mother   . Diabetes Maternal Grandmother   . Stroke Maternal Grandmother     Allergies: Patient has no known allergies. Current Outpatient Prescriptions on File Prior to Visit  Medication Sig Dispense Refill  . atorvastatin (LIPITOR) 40 MG tablet Take 1 tablet (40 mg total) by mouth at bedtime. 90 tablet 3  . glimepiride (AMARYL) 4 MG tablet Take 1 tablet (4 mg total) by mouth daily. 90 tablet 3  . losartan-hydrochlorothiazide (HYZAAR) 100-25 MG tablet Take 1 tablet by mouth daily. 90 tablet 3  . sitaGLIPtin-metformin (JANUMET) 50-1000 MG tablet Take 1 tablet by mouth 2 (two) times daily with a meal. 180 tablet 3  . glucose blood (ONE TOUCH TEST STRIPS) test strip Check twice daily.  250.02 (Patient not taking: Reported on 07/20/2016) 60 each 12   No current facility-administered medications on file prior to visit.     Social History  Substance Use Topics  . Smoking status: Never Smoker  . Smokeless tobacco: Never Used  . Alcohol use Yes     Comment: Occasional    Review of Systems  Constitutional: Negative  for chills and fever.  Respiratory: Negative for cough.   Cardiovascular: Negative for chest pain and palpitations.  Gastrointestinal: Negative for nausea and vomiting.  Skin: Positive for wound.      Objective:    BP 138/90   Pulse 95   Temp 98.1 F (36.7 C) (Oral)   Ht 5\' 11"  (1.803 m)   Wt (!) 314 lb (142.4 kg)   SpO2 95%   BMI 43.79 kg/m    Physical Exam  Constitutional: He appears well-developed and well-nourished.  Cardiovascular: Regular rhythm and normal heart sounds.   Pulmonary/Chest: Effort normal and breath sounds normal. No respiratory distress. He has no wheezes. He has no rhonchi. He has no rales.  Neurological: He is alert.  Skin: Skin is warm and dry.     Non fluctuant firm tender area approx 3cm noted left buttocks proximal to crease of buttocks.  No drainage, erythema.   Psychiatric: He has a normal mood and affect. His speech is normal and behavior is normal.  Vitals reviewed.      Assessment & Plan:   1. Abscess Non fluctuant; discussed with patient that unlikely to benefit from I & D; advised PO antibiotics and warm compresses. Return precautions given. Note: had taken BP 15 minutes prior to appt. Repeated and has come down.  - cephALEXin (KEFLEX) 500 MG  capsule; Take 1 capsule (500 mg total) by mouth every 6 (six) hours.  Dispense: 28 capsule; Refill: 0    I am having Jason Whitney start on cephALEXin. I am also having him maintain his glucose blood, sitaGLIPtin-metformin, losartan-hydrochlorothiazide, glimepiride, and atorvastatin.   Meds ordered this encounter  Medications  . cephALEXin (KEFLEX) 500 MG capsule    Sig: Take 1 capsule (500 mg total) by mouth every 6 (six) hours.    Dispense:  28 capsule    Refill:  0    Order Specific Question:   Supervising Provider    Answer:   Jason Whitney [[id number]]    Return precautions given.   Risks, benefits, and alternatives of the medications and treatment plan prescribed today were discussed, and  patient expressed understanding.   Education regarding symptom management and diagnosis given to patient on AVS.  Continue to follow with Jason Boyden, MD for routine health maintenance.   Jason Whitney and I agreed with plan.   Jason Plowman, FNP

## 2016-07-20 NOTE — Progress Notes (Signed)
Pre visit review using our clinic review tool, if applicable. No additional management support is needed unless otherwise documented below in the visit note. 

## 2016-11-05 ENCOUNTER — Encounter: Payer: Self-pay | Admitting: Family Medicine

## 2016-11-05 ENCOUNTER — Ambulatory Visit (INDEPENDENT_AMBULATORY_CARE_PROVIDER_SITE_OTHER): Payer: 59 | Admitting: Family Medicine

## 2016-11-05 VITALS — BP 130/96 | HR 92 | Temp 98.2°F | Ht 71.0 in | Wt 320.0 lb

## 2016-11-05 DIAGNOSIS — E119 Type 2 diabetes mellitus without complications: Secondary | ICD-10-CM

## 2016-11-05 DIAGNOSIS — E785 Hyperlipidemia, unspecified: Secondary | ICD-10-CM

## 2016-11-05 DIAGNOSIS — I10 Essential (primary) hypertension: Secondary | ICD-10-CM

## 2016-11-05 MED ORDER — ONETOUCH ULTRA MINI W/DEVICE KIT: PACK | 0 refills | Status: DC

## 2016-11-05 MED ORDER — GLUCOSE BLOOD VI STRP
ORAL_STRIP | 12 refills | Status: DC
Start: 2016-11-05 — End: 2018-01-09

## 2016-11-05 NOTE — Assessment & Plan Note (Addendum)
Discussed healthy diet and lifestyle changes to affect sustainable weight loss.  Pt states he is require to have OSA evaluation due to neck girth. No significant apnea/snoring or daytime sleepiness. ESS today = 1. I have referred to pulm today.

## 2016-11-05 NOTE — Patient Instructions (Addendum)
Glucose meter sent to pharmacy  Return for physical with fasting labs.  See Shirlee LimerickMarion on your way out for lung doctor evaluation.  Good luck with the possible new job!

## 2016-11-05 NOTE — Assessment & Plan Note (Signed)
Chronic, mildly elevated today. Reassess next visit.

## 2016-11-05 NOTE — Assessment & Plan Note (Signed)
Foot exam today. Does not check sugars. New one touch meter sent in.

## 2016-11-05 NOTE — Progress Notes (Signed)
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   MRN: [id number]  HPI: Jason Whitney Rahming is a 41 y.o. male presenting on 11/05/2016 for Office Visit (discuss Sleep [place] test)   Last seen 01/2016. Has had a few cancellations in the past year.  Required to have sleep apnea evaluation due to neck girth.  He does not regularly snore. No witnessed apneic episodes. No PNdyspnea.  Wakes up feeling rested. No daytime somnolence.   DM - requests new glucose meter and strips. Does not regular check sugars. He did miss medications for 1 week last month. HTN - compliant with current regimen - he did miss medications for 1 week.   Relevant past medical, surgical, family and social history reviewed and updated as indicated. Interim medical history since our last visit reviewed. Allergies and medications reviewed and updated. Outpatient Medications Prior to Visit  Medication Sig Dispense Refill  . atorvastatin (LIPITOR) 40 MG tablet Take 1 tablet (40 mg total) by mouth at bedtime. 90 tablet 3  . glimepiride (AMARYL) 4 MG tablet Take 1 tablet (4 mg total) by mouth daily. 90 tablet 3  . losartan-hydrochlorothiazide (HYZAAR) 100-25 MG tablet Take 1 tablet by mouth daily. 90 tablet 3  . sitaGLIPtin-metformin (JANUMET) 50-1000 MG tablet Take 1 tablet by mouth 2 (two) times daily with a meal. 180 tablet 3  . glucose blood (ONE TOUCH TEST STRIPS) test strip Check twice daily.  250.02 60 each 12  . cephALEXin (KEFLEX) 500 MG capsule Take 1 capsule (500 mg total) by mouth every 6 (six) hours. 28 capsule 0   No facility-administered medications prior to visit.      Per HPI unless specifically indicated in ROS section below Review of Systems     Objective:    BP (!) 130/96   Pulse 92   Temp 98.2 F (36.8 C)   Ht  5\' 11"  (1.803 m)   Wt (!) 320 lb (145.2 kg)   SpO2 95%   BMI 44.63 kg/m   Wt Readings from Last 3 Encounters:  11/05/16 (!) 320 lb (145.2 kg)  07/20/16 (!) 314 lb (142.4 kg)  02/05/16 (!) 318 lb (144.2 kg)    Physical Exam  Constitutional: He appears well-developed and well-nourished. No distress.  HENT:  Head: Normocephalic and atraumatic.  Right Ear: External ear normal.  Left Ear: External ear normal.  Nose: Nose normal.  Mouth/Throat: Oropharynx is clear and moist. No oropharyngeal exudate.  Eyes: Conjunctivae and EOM are normal. Pupils are equal, round, and reactive to light. No scleral icterus.  Neck: Normal range of motion. Neck supple.  Cardiovascular: Normal rate, regular rhythm, normal heart sounds and intact distal pulses.   No murmur heard. Pulmonary/Chest: Effort normal and breath sounds normal. No respiratory distress. He has no wheezes. He has no rales.  Musculoskeletal: He exhibits no edema.  See HPI for foot exam if done  Lymphadenopathy:    He has no cervical adenopathy.  Skin: Skin is warm and dry. No rash noted.  Psychiatric: He has a normal mood and affect.  Nursing note and vitals reviewed.  Results for orders placed or performed in visit on 02/05/16  Lipid panel  Result Value Ref Range   Cholesterol, Total 225 (H) 100 - 199 mg/dL   Triglycerides 161 0 - 149 mg/dL   HDL 29 (L) >09 mg/dL   VLDL Cholesterol Cal 21 5 - 40 mg/dL   LDL Calculated 604 (H) 0 - 99 mg/dL   Chol/HDL Ratio 7.8 (H) 0.0 - 5.0 ratio units  Comprehensive metabolic panel  Result Value Ref Range   Glucose 131 (H) 65 - 99 mg/dL   BUN 19 6 - 20 mg/dL   Creatinine, Ser 5.40 0.76 - 1.27 mg/dL   GFR calc non Af Amer 77 >59 mL/min/1.73   GFR calc Af Amer 89 >59 mL/min/1.73   BUN/Creatinine Ratio 16 9 - 20   Sodium 139 134 - 144 mmol/L   Potassium 4.1 3.5 - 5.2 mmol/L   Chloride 99 96 - 106 mmol/L   CO2 25 18 - 29 mmol/L   Calcium 9.5 8.7 - 10.2 mg/dL   Total Protein 8.0 6.0 - 8.5  g/dL   Albumin 4.1 3.5 - 5.5 g/dL   Globulin, Total 3.9 1.5 - 4.5 g/dL   Albumin/Globulin Ratio 1.1 (L) 1.2 - 2.2   Bilirubin Total 0.5 0.0 - 1.2 mg/dL   Alkaline Phosphatase 79 39 - 117 IU/L   AST 26 0 - 40 IU/L   ALT 37 0 - 44 IU/L  Hemoglobin A1c  Result Value Ref Range   Hgb A1c MFr Bld 9.5 (H) 4.8 - 5.6 %   Est. average glucose Bld gHb Est-mCnc 226 mg/dL  Lipase  Result Value Ref Range   Lipase 47 0 - 59 U/L      Assessment & Plan:   Problem List Items Addressed This Visit    Diabetes type 2, controlled (HCC) - Primary    Foot exam today. Does not check sugars. New one touch meter sent in.       Essential hypertension (Chronic)    Chronic, mildly elevated today. Reassess next visit.       HLD (hyperlipidemia) (Chronic)    Check FLP when returns fasting on Thursday.       Severe obesity (BMI >= 40) (HCC)    Discussed healthy diet and lifestyle changes to affect sustainable weight loss.  Pt states he is require to have OSA evaluation due to neck girth. No significant apnea/snoring or daytime sleepiness. ESS today = 1. I have referred to pulm today.       Relevant Orders   Ambulatory referral to Pulmonology       Follow up plan: No Follow-up on file.  Eustaquio Boyden, MD

## 2016-11-05 NOTE — Assessment & Plan Note (Signed)
Check FLP when returns fasting on Thursday.

## 2016-11-11 ENCOUNTER — Encounter: Payer: Self-pay | Admitting: Family Medicine

## 2016-11-11 ENCOUNTER — Ambulatory Visit (INDEPENDENT_AMBULATORY_CARE_PROVIDER_SITE_OTHER): Payer: 59 | Admitting: Family Medicine

## 2016-11-11 VITALS — BP 138/90 | HR 96 | Temp 98.4°F | Ht 71.0 in | Wt 319.0 lb

## 2016-11-11 DIAGNOSIS — E1165 Type 2 diabetes mellitus with hyperglycemia: Secondary | ICD-10-CM | POA: Diagnosis not present

## 2016-11-11 DIAGNOSIS — Z0000 Encounter for general adult medical examination without abnormal findings: Secondary | ICD-10-CM | POA: Diagnosis not present

## 2016-11-11 DIAGNOSIS — E785 Hyperlipidemia, unspecified: Secondary | ICD-10-CM

## 2016-11-11 DIAGNOSIS — IMO0001 Reserved for inherently not codable concepts without codable children: Secondary | ICD-10-CM

## 2016-11-11 DIAGNOSIS — I10 Essential (primary) hypertension: Secondary | ICD-10-CM | POA: Diagnosis not present

## 2016-11-11 MED ORDER — GLIMEPIRIDE 4 MG PO TABS: 4.0000 mg | ORAL_TABLET | Freq: Every day | ORAL | 3 refills | Status: DC

## 2016-11-11 MED ORDER — LOSARTAN POTASSIUM-HCTZ 100-25 MG PO TABS: 1.0000 | ORAL_TABLET | Freq: Every day | ORAL | 3 refills | Status: DC

## 2016-11-11 MED ORDER — ATORVASTATIN CALCIUM 40 MG PO TABS: 40.0000 mg | ORAL_TABLET | Freq: Every day | ORAL | 3 refills | Status: DC

## 2016-11-11 NOTE — Assessment & Plan Note (Signed)
Update FLP on lipitor.  

## 2016-11-11 NOTE — Assessment & Plan Note (Addendum)
Update labs. He has restarted janumet and amaryl (had run out for a week last month).

## 2016-11-11 NOTE — Assessment & Plan Note (Signed)
Chronic, mildly elevated today. Continue hyzaar.

## 2016-11-11 NOTE — Assessment & Plan Note (Addendum)
Discussed healthy diet and lifestyle changes to affect sustainable weight loss.  Encouraged he start regular walking regimen.

## 2016-11-11 NOTE — Assessment & Plan Note (Signed)
Preventative protocols reviewed and updated unless pt declined. Discussed healthy diet and lifestyle.  

## 2016-11-11 NOTE — Progress Notes (Signed)
BP 138/90 (BP Location: Left Arm, Patient Position: Sitting, Cuff Size: Large)   Pulse 96   Temp 98.4 F (36.9 C) (Oral)   Ht _0  (1.803 m)   Wt (!) 319 lb (144.7 kg)   SpO2 97%   BMI 44.49 kg/m    CC: CPE Subjective:    Patient ID: Jason Whitney, male    DOB: 17-Apr-1976, 41 y.o.   MRN: [id number]  HPI: Jason Whitney is a 41 y.o. male presenting on 11/11/2016 for Annual Exam   Mother with health problems over last 4 months. She was on life support for months, slowly recovering. He worries about this.  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  DM - new one touch glucometer sent in last week. States pharmacy was out of this - will pass by today.  Preventative: Flu shot yearly Pneumovax 11/2012 Tdap 2012  Seat belt use discussed. Sunscreen use discussed. No changing moles on skin. Non smoker Alcohol - occasional  Caffeine: little Lives with girlfriend and daughter (2006); no pets  12th grade education  Activity: no regular exercise  Diet: good water, fruits/vegetables daily   Relevant past medical, surgical, family and social history reviewed and updated as indicated. Interim medical history since our last visit reviewed. Allergies and medications reviewed and updated. Outpatient Medications Prior to Visit  Medication Sig Dispense Refill  . Blood Glucose Monitoring Suppl (ONE TOUCH ULTRA MINI) w/Device KIT Use as directed 1 each 0  . glucose blood (ONE TOUCH TEST STRIPS) test strip Check twice daily.  E11.9 60 each 12  . sitaGLIPtin-metformin (JANUMET) 50-1000 MG tablet Take 1 tablet by mouth 2 (two) times daily with a meal. 180 tablet 3  . atorvastatin (LIPITOR) 40 MG tablet Take 1 tablet (40 mg total) by mouth at bedtime. 90 tablet 3  . glimepiride (AMARYL) 4 MG tablet Take 1 tablet (4 mg total) by mouth daily. 90 tablet 3  . losartan-hydrochlorothiazide (HYZAAR) 100-25 MG tablet Take 1 tablet by mouth daily. 90 tablet 3   No facility-administered medications prior to visit.       Per HPI unless specifically indicated in ROS section below Review of Systems  Constitutional: Negative for activity change, appetite change, chills, fatigue, fever and unexpected weight change.  HENT: Negative for hearing loss.   Eyes: Negative for visual disturbance.  Respiratory: Negative for cough, chest tightness, shortness of breath and wheezing.   Cardiovascular: Negative for chest pain, palpitations and leg swelling.  Gastrointestinal: Negative for abdominal distention, abdominal pain, blood in stool, constipation, diarrhea, nausea and vomiting.  Genitourinary: Negative for difficulty urinating and hematuria.  Musculoskeletal: Negative for arthralgias, myalgias and neck pain.  Skin: Negative for rash.  Neurological: Negative for dizziness, seizures, syncope and headaches.  Hematological: Negative for adenopathy. Does not bruise/bleed easily.  Psychiatric/Behavioral: Negative for dysphoric mood. The patient is not nervous/anxious.        Objective:    BP 138/90 (BP Location: Left Arm, Patient Position: Sitting, Cuff Size: Large)   Pulse 96   Temp 98.4 F (36.9 C) (Oral)   Ht _1  (1.803 m)   Wt (!) 319 lb (144.7 kg)   SpO2 97%   BMI 44.49 kg/m   Wt Readings from Last 3 Encounters:  11/11/16 (!) 319 lb (144.7 kg)  11/05/16 (!) 320 lb (145.2 kg)  07/20/16 (!) 314 lb (142.4 kg)    Physical Exam  Constitutional: He is oriented to person, place, and time. He appears well-developed and well-nourished. No distress.  HENT:  Head: Normocephalic and atraumatic.  Right Ear: Hearing, tympanic membrane, external ear and ear canal normal.  Left Ear: Hearing, tympanic membrane, external ear and ear canal normal.  Nose: Nose normal.  Mouth/Throat: Uvula is midline, oropharynx is clear and moist and mucous membranes are normal. No oropharyngeal exudate, posterior oropharyngeal edema or posterior oropharyngeal erythema.  Eyes: Conjunctivae and EOM are normal. Pupils are equal,  round, and reactive to light. No scleral icterus.  Neck: Normal range of motion. Neck supple. No thyromegaly present.  Cardiovascular: Normal rate, regular rhythm, normal heart sounds and intact distal pulses.   No murmur heard. Pulses:      Radial pulses are 2+ on the right side, and 2+ on the left side.  Pulmonary/Chest: Effort normal and breath sounds normal. No respiratory distress. He has no wheezes. He has no rales.  Abdominal: Soft. Bowel sounds are normal. He exhibits no distension and no mass. There is no tenderness. There is no rebound and no guarding.  Musculoskeletal: Normal range of motion. He exhibits no edema.  Lymphadenopathy:    He has no cervical adenopathy.  Neurological: He is alert and oriented to person, place, and time.  CN grossly intact, station and gait intact  Skin: Skin is warm and dry. No rash noted.  Psychiatric: He has a normal mood and affect. His behavior is normal. Judgment and thought content normal.  Nursing note and vitals reviewed.  Results for orders placed or performed in visit on 02/05/16  Lipid panel  Result Value Ref Range   Cholesterol, Total 225 (H) 100 - 199 mg/dL   Triglycerides 106 0 - 149 mg/dL   HDL 29 (L) >39 mg/dL   VLDL Cholesterol Cal 21 5 - 40 mg/dL   LDL Calculated 175 (H) 0 - 99 mg/dL   Chol/HDL Ratio 7.8 (H) 0.0 - 5.0 ratio units  Comprehensive metabolic panel  Result Value Ref Range   Glucose 131 (H) 65 - 99 mg/dL   BUN 19 6 - 20 mg/dL   Creatinine, Ser 1.20 0.76 - 1.27 mg/dL   GFR calc non Af Amer 77 >59 mL/min/1.73   GFR calc Af Amer 89 >59 mL/min/1.73   BUN/Creatinine Ratio 16 9 - 20   Sodium 139 134 - 144 mmol/L   Potassium 4.1 3.5 - 5.2 mmol/L   Chloride 99 96 - 106 mmol/L   CO2 25 18 - 29 mmol/L   Calcium 9.5 8.7 - 10.2 mg/dL   Total Protein 8.0 6.0 - 8.5 g/dL   Albumin 4.1 3.5 - 5.5 g/dL   Globulin, Total 3.9 1.5 - 4.5 g/dL   Albumin/Globulin Ratio 1.1 (L) 1.2 - 2.2   Bilirubin Total 0.5 0.0 - 1.2 mg/dL    Alkaline Phosphatase 79 39 - 117 IU/L   AST 26 0 - 40 IU/L   ALT 37 0 - 44 IU/L  Hemoglobin A1c  Result Value Ref Range   Hgb A1c MFr Bld 9.5 (H) 4.8 - 5.6 %   Est. average glucose Bld gHb Est-mCnc 226 mg/dL  Lipase  Result Value Ref Range   Lipase 47 0 - 59 U/L      Assessment & Plan:   Problem List Items Addressed This Visit    Diabetes mellitus type 2, uncontrolled ([facility])    Update labs. He has restarted janumet and amaryl (had run out for a week last month).      Relevant Medications   atorvastatin (LIPITOR) 40 MG tablet   glimepiride (AMARYL) 4  MG tablet   losartan-hydrochlorothiazide (HYZAAR) 100-25 MG tablet   Other Relevant Orders   Hemoglobin A1c   Essential hypertension (Chronic)    Chronic, mildly elevated today. Continue hyzaar.       Relevant Medications   atorvastatin (LIPITOR) 40 MG tablet   losartan-hydrochlorothiazide (HYZAAR) 100-25 MG tablet   Health maintenance examination - Primary    Preventative protocols reviewed and updated unless pt declined. Discussed healthy diet and lifestyle.       HLD (hyperlipidemia) (Chronic)    Update FLP on lipitor.       Relevant Medications   atorvastatin (LIPITOR) 40 MG tablet   losartan-hydrochlorothiazide (HYZAAR) 100-25 MG tablet   Other Relevant Orders   Lipid panel   Comprehensive metabolic panel   Severe obesity (BMI >= 40) (HCC)    Discussed healthy diet and lifestyle changes to affect sustainable weight loss.  Encouraged he start regular walking regimen.      Relevant Medications   glimepiride (AMARYL) 4 MG tablet       Follow up plan: Return in about 3 months (around 02/11/2017) for follow up visit.  Ria Bush, MD

## 2016-11-11 NOTE — Patient Instructions (Addendum)
Labs today.  Start regular walking regimen. Good to see you today, call us with questions.  Return as needed or in 3-4 months or follow up visit.   Health Maintenance, Male A healthy lifestyle and preventive care is important for your health and wellness. Ask your health care provider about what schedule of regular examinations is right for you. What should I know about weight and diet? Eat a Healthy Diet  Eat plenty of vegetables, fruits, whole grains, low-fat dairy products, and lean protein.  Do not eat a lot of foods high in solid fats, added sugars, or salt.  Maintain a Healthy Weight Regular exercise can help you achieve or maintain a healthy weight. You should:  Do at least 150 minutes of exercise each week. The exercise should increase your heart rate and make you sweat (moderate-intensity exercise).  Do strength-training exercises at least twice a week.  Watch Your Levels of Cholesterol and Blood Lipids  Have your blood tested for lipids and cholesterol every 5 years starting at 39 years of age. If you are at high risk for heart disease, you should start having your blood tested when you are 39 years old. You may need to have your cholesterol levels checked more often if: ? Your lipid or cholesterol levels are high. ? You are older than 39 years of age. ? You are at high risk for heart disease.  What should I know about cancer screening? Many types of cancers can be detected early and may often be prevented. Lung Cancer  You should be screened every year for lung cancer if: ? You are a current smoker who has smoked for at least 30 years. ? You are a former smoker who has quit within the past 15 years.  Talk to your health care provider about your screening options, when you should start screening, and how often you should be screened.  Colorectal Cancer  Routine colorectal cancer screening usually begins at 39 years of age and should be repeated every 5-10 years until  you are 39 years old. You may need to be screened more often if early forms of precancerous polyps or small growths are found. Your health care provider may recommend screening at an earlier age if you have risk factors for colon cancer.  Your health care provider may recommend using home test kits to check for hidden blood in the stool.  A small camera at the end of a tube can be used to examine your colon (sigmoidoscopy or colonoscopy). This checks for the earliest forms of colorectal cancer.  Prostate and Testicular Cancer  Depending on your age and overall health, your health care provider may do certain tests to screen for prostate and testicular cancer.  Talk to your health care provider about any symptoms or concerns you have about testicular or prostate cancer.  Skin Cancer  Check your skin from head to toe regularly.  Tell your health care provider about any new moles or changes in moles, especially if: ? There is a change in a mole's size, shape, or color. ? You have a mole that is larger than a pencil eraser.  Always use sunscreen. Apply sunscreen liberally and repeat throughout the day.  Protect yourself by wearing long sleeves, pants, a wide-brimmed hat, and sunglasses when outside.  What should I know about heart disease, diabetes, and high blood pressure?  If you are 7718-39 years of age, have your blood pressure checked every 3-5 years. If you are 40 years  of age or older, have your blood pressure checked every year. You should have your blood pressure measured twice-once when you are at a hospital or clinic, and once when you are not at a hospital or clinic. Record the average of the two measurements. To check your blood pressure when you are not at a hospital or clinic, you can use: ? An automated blood pressure machine at a pharmacy. ? A home blood pressure monitor.  Talk to your health care provider about your target blood pressure.  If you are between 25-79 years  old, ask your health care provider if you should take aspirin to prevent heart disease.  Have regular diabetes screenings by checking your fasting blood sugar level. ? If you are at a normal weight and have a low risk for diabetes, have this test once every three years after the age of 31. ? If you are overweight and have a high risk for diabetes, consider being tested at a younger age or more often.  A one-time screening for abdominal aortic aneurysm (AAA) by ultrasound is recommended for men aged 58-75 years who are current or former smokers. What should I know about preventing infection? Hepatitis B If you have a higher risk for hepatitis B, you should be screened for this virus. Talk with your health care provider to find out if you are at risk for hepatitis B infection. Hepatitis C Blood testing is recommended for:  Everyone born from [birth year] through 1963.  Anyone with known risk factors for hepatitis C.  Sexually Transmitted Diseases (STDs)  You should be screened each year for STDs including gonorrhea and chlamydia if: ? You are sexually active and are younger than 39 years of age. ? You are older than 39 years of age and your health care provider tells you that you are at risk for this type of infection. ? Your sexual activity has changed since you were last screened and you are at an increased risk for chlamydia or gonorrhea. Ask your health care provider if you are at risk.  Talk with your health care provider about whether you are at high risk of being infected with HIV. Your health care provider may recommend a prescription medicine to help prevent HIV infection.  What else can I do?  Schedule regular health, dental, and eye exams.  Stay current with your vaccines (immunizations).  Do not use any tobacco products, such as cigarettes, chewing tobacco, and e-cigarettes. If you need help quitting, ask your health care provider.  Limit alcohol intake to no more than 2 drinks  per day. One drink equals 12 ounces of beer, 5 ounces of wine, or 1 ounces of hard liquor.  Do not use street drugs.  Do not share needles.  Ask your health care provider for help if you need support or information about quitting drugs.  Tell your health care provider if you often feel depressed.  Tell your health care provider if you have ever been abused or do not feel safe at home. This information is not intended to replace advice given to you by your health care provider. Make sure you discuss any questions you have with your health care provider. Document Released: 10/26/2007 Document Revised: 12/27/2015 Document Reviewed: 01/31/2015 Elsevier Interactive Patient Education  [employer].

## 2016-11-12 LAB — COMPREHENSIVE METABOLIC PANEL
A/G RATIO: 1.1 — AB (ref 1.2–2.2)
ALT: 46 IU/L — AB (ref 0–44)
AST: 31 IU/L (ref 0–40)
Albumin: 4.1 g/dL (ref 3.5–5.5)
Alkaline Phosphatase: 89 IU/L (ref 39–117)
BUN/Creatinine Ratio: 11 (ref 9–20)
BUN: 28 mg/dL — AB (ref 6–20)
Bilirubin Total: 0.5 mg/dL (ref 0.0–1.2)
CALCIUM: 9.2 mg/dL (ref 8.7–10.2)
CHLORIDE: 105 mmol/L (ref 96–106)
CO2: 21 mmol/L (ref 20–29)
Creatinine, Ser: 2.56 mg/dL — ABNORMAL HIGH (ref 0.76–1.27)
GFR, EST AFRICAN AMERICAN: 35 mL/min/{1.73_m2} — AB (ref >59)
GFR, EST NON AFRICAN AMERICAN: 31 mL/min/{1.73_m2} — AB (ref >59)
GLUCOSE: 115 mg/dL — AB (ref 65–99)
Globulin, Total: 3.7 g/dL (ref 1.5–4.5)
Potassium: 4.5 mmol/L (ref 3.5–5.2)
Sodium: 144 mmol/L (ref 134–144)
Total Protein: 7.8 g/dL (ref 6.0–8.5)

## 2016-11-12 LAB — LIPID PANEL
CHOL/HDL RATIO: 6 ratio — AB (ref 0.0–5.0)
Cholesterol, Total: 163 mg/dL (ref 100–199)
HDL: 27 mg/dL — ABNORMAL LOW (ref >39)
LDL CALC: 112 mg/dL — AB (ref 0–99)
Triglycerides: 121 mg/dL (ref 0–149)
VLDL Cholesterol Cal: 24 mg/dL (ref 5–40)

## 2016-11-12 LAB — HEMOGLOBIN A1C
ESTIMATED AVERAGE GLUCOSE: 269 mg/dL
Hgb A1c MFr Bld: 11 % — ABNORMAL HIGH (ref 4.8–5.6)

## 2016-11-15 ENCOUNTER — Telehealth: Payer: Self-pay | Admitting: *Deleted

## 2016-11-15 ENCOUNTER — Other Ambulatory Visit: Payer: Self-pay | Admitting: Family Medicine

## 2016-11-15 MED ORDER — SITAGLIPTIN PHOSPHATE 50 MG PO TABS: 50.0000 mg | ORAL_TABLET | Freq: Every day | ORAL | 3 refills | Status: DC

## 2016-11-15 NOTE — Telephone Encounter (Signed)
Jason Whitney is out of [place] and pt is also requesting a 90D refill

## 2016-11-20 ENCOUNTER — Other Ambulatory Visit: Payer: Self-pay | Admitting: Family Medicine

## 2016-11-20 DIAGNOSIS — IMO0001 Reserved for inherently not codable concepts without codable children: Secondary | ICD-10-CM

## 2016-11-20 DIAGNOSIS — N289 Disorder of kidney and ureter, unspecified: Secondary | ICD-10-CM

## 2016-11-20 DIAGNOSIS — E1165 Type 2 diabetes mellitus with hyperglycemia: Secondary | ICD-10-CM

## 2016-11-21 ENCOUNTER — Other Ambulatory Visit (INDEPENDENT_AMBULATORY_CARE_PROVIDER_SITE_OTHER): Payer: 59

## 2016-11-21 ENCOUNTER — Encounter: Payer: 59 | Admitting: Family Medicine

## 2016-11-21 DIAGNOSIS — E1165 Type 2 diabetes mellitus with hyperglycemia: Secondary | ICD-10-CM | POA: Diagnosis not present

## 2016-11-21 DIAGNOSIS — IMO0001 Reserved for inherently not codable concepts without codable children: Secondary | ICD-10-CM

## 2016-11-21 DIAGNOSIS — N289 Disorder of kidney and ureter, unspecified: Secondary | ICD-10-CM

## 2016-11-21 NOTE — Addendum Note (Signed)
Addended by: Baldomero LamyHAVERS, Ashauna Bertholf C on: 11/21/2016 03:56 PM   Modules accepted: Orders

## 2016-11-22 LAB — VITAMIN D 25 HYDROXY (VIT D DEFICIENCY, FRACTURES): Vit D, 25-Hydroxy: 13.1 ng/mL — ABNORMAL LOW (ref 30.0–100.0)

## 2016-11-22 LAB — CBC WITH DIFFERENTIAL/PLATELET
BASOS: 0 %
Basophils Absolute: 0 10*3/uL (ref 0.0–0.2)
EOS (ABSOLUTE): 0.1 10*3/uL (ref 0.0–0.4)
EOS: 1 %
HEMATOCRIT: 38.8 % (ref 37.5–51.0)
HEMOGLOBIN: 13.3 g/dL (ref 13.0–17.7)
IMMATURE GRANS (ABS): 0 10*3/uL (ref 0.0–0.1)
IMMATURE GRANULOCYTES: 0 %
LYMPHS: 29 %
Lymphocytes Absolute: 4.4 10*3/uL — ABNORMAL HIGH (ref 0.7–3.1)
MCH: 28.1 pg (ref 26.6–33.0)
MCHC: 34.3 g/dL (ref 31.5–35.7)
MCV: 82 fL (ref 79–97)
MONOCYTES: 8 %
Monocytes Absolute: 1.2 10*3/uL — ABNORMAL HIGH (ref 0.1–0.9)
NEUTROS ABS: 9.6 10*3/uL — AB (ref 1.4–7.0)
NEUTROS PCT: 62 %
PLATELETS: 342 10*3/uL (ref 150–379)
RBC: 4.73 x10E6/uL (ref 4.14–5.80)
RDW: 13.9 % (ref 12.3–15.4)
WBC: 15.4 10*3/uL — ABNORMAL HIGH (ref 3.4–10.8)

## 2016-11-22 LAB — PROTEIN ELECTROPHORESIS, SERUM, WITH REFLEX
A/G Ratio: 0.8 (ref 0.7–1.7)
ALBUMIN ELP: 3.5 g/dL (ref 2.9–4.4)
ALPHA 1: 0.2 g/dL (ref 0.0–0.4)
ALPHA 2: 1.1 g/dL — AB (ref 0.4–1.0)
BETA: 1.6 g/dL — AB (ref 0.7–1.3)
GLOBULIN, TOTAL: 4.6 g/dL — AB (ref 2.2–3.9)
Gamma Globulin: 1.7 g/dL (ref 0.4–1.8)
Total Protein: 8.1 g/dL (ref 6.0–8.5)

## 2016-11-22 LAB — RENAL FUNCTION PANEL
Albumin: 4.4 g/dL (ref 3.5–5.5)
BUN / CREAT RATIO: 14 (ref 9–20)
BUN: 23 mg/dL — AB (ref 6–20)
CALCIUM: 9.9 mg/dL (ref 8.7–10.2)
CHLORIDE: 104 mmol/L (ref 96–106)
CO2: 22 mmol/L (ref 20–29)
Creatinine, Ser: 1.59 mg/dL — ABNORMAL HIGH (ref 0.76–1.27)
GFR calc non Af Amer: 54 mL/min/{1.73_m2} — ABNORMAL LOW (ref >59)
GFR, EST AFRICAN AMERICAN: 63 mL/min/{1.73_m2} (ref >59)
GLUCOSE: 78 mg/dL (ref 65–99)
Phosphorus: 4.3 mg/dL (ref 2.5–4.5)
Potassium: 4.3 mmol/L (ref 3.5–5.2)
SODIUM: 144 mmol/L (ref 134–144)

## 2016-11-22 LAB — FRUCTOSAMINE: FRUCTOSAMINE: 343 umol/L — AB (ref 0–285)

## 2016-11-23 ENCOUNTER — Other Ambulatory Visit: Payer: Self-pay | Admitting: Family Medicine

## 2016-11-23 MED ORDER — VITAMIN D3 1.25 MG (50000 UT) PO TABS: 1.0000 | ORAL_TABLET | ORAL | 1 refills | Status: DC

## 2016-12-05 ENCOUNTER — Ambulatory Visit (INDEPENDENT_AMBULATORY_CARE_PROVIDER_SITE_OTHER): Payer: 59 | Admitting: Internal Medicine

## 2016-12-05 ENCOUNTER — Encounter: Payer: Self-pay | Admitting: Family Medicine

## 2016-12-05 ENCOUNTER — Ambulatory Visit (INDEPENDENT_AMBULATORY_CARE_PROVIDER_SITE_OTHER): Payer: 59 | Admitting: Family Medicine

## 2016-12-05 ENCOUNTER — Encounter: Payer: Self-pay | Admitting: Internal Medicine

## 2016-12-05 VITALS — BP 152/86 | HR 82 | Temp 98.5°F | Wt 320.5 lb

## 2016-12-05 VITALS — BP 128/82 | HR 87 | Ht 71.0 in | Wt 319.0 lb

## 2016-12-05 DIAGNOSIS — E559 Vitamin D deficiency, unspecified: Secondary | ICD-10-CM | POA: Diagnosis not present

## 2016-12-05 DIAGNOSIS — E785 Hyperlipidemia, unspecified: Secondary | ICD-10-CM

## 2016-12-05 DIAGNOSIS — D72829 Elevated white blood cell count, unspecified: Secondary | ICD-10-CM | POA: Diagnosis not present

## 2016-12-05 DIAGNOSIS — N289 Disorder of kidney and ureter, unspecified: Secondary | ICD-10-CM | POA: Insufficient documentation

## 2016-12-05 DIAGNOSIS — E1165 Type 2 diabetes mellitus with hyperglycemia: Secondary | ICD-10-CM | POA: Diagnosis not present

## 2016-12-05 DIAGNOSIS — I10 Essential (primary) hypertension: Secondary | ICD-10-CM

## 2016-12-05 DIAGNOSIS — G4719 Other hypersomnia: Secondary | ICD-10-CM | POA: Diagnosis not present

## 2016-12-05 DIAGNOSIS — IMO0001 Reserved for inherently not codable concepts without codable children: Secondary | ICD-10-CM

## 2016-12-05 DIAGNOSIS — N179 Acute kidney failure, unspecified: Secondary | ICD-10-CM | POA: Insufficient documentation

## 2016-12-05 MED ORDER — SITAGLIPTIN PHOSPHATE 50 MG PO TABS: 50.0000 mg | ORAL_TABLET | Freq: Every day | ORAL | 1 refills | Status: DC

## 2016-12-05 MED ORDER — LOSARTAN POTASSIUM 50 MG PO TABS: 50.0000 mg | ORAL_TABLET | Freq: Every day | ORAL | 1 refills | Status: DC

## 2016-12-05 NOTE — Assessment & Plan Note (Signed)
He will pick up 50k weekly dose.

## 2016-12-05 NOTE — Assessment & Plan Note (Signed)
Chronically uncontrolled. He is checking sugars twice weekly. I advised closer monitoring. Continue current regimen of januvia 50mg  and amaryl 4mg  daily. RTC 3 mo f/u visit.

## 2016-12-05 NOTE — Patient Instructions (Signed)
Please obtain sleep study for further evaluation

## 2016-12-05 NOTE — Assessment & Plan Note (Signed)
Mild elevation this month - without signs of infection. SPEP with evidence of mild inflammation as well. Will continue to monitor.

## 2016-12-05 NOTE — Assessment & Plan Note (Addendum)
Acute renal insufficiency anticipate related to diabetic nephropathy. Discussed this with patient, reviewed importance of tighter glycemic control. Recent fructosamine levels were improved compared to prior A1c. Will continue off metformin, continue lower januvia and lower losartan regimen (off hctz). I have asked him to get labs drawn in 10 days (as we're restarting losartan 50mg ) at Saturday [facility] draw station

## 2016-12-05 NOTE — Progress Notes (Signed)
Name: Carolos Fecher MRN: [id number] DOB: 1975-07-15     CONSULTATION DATE: 12/05/2016   REFERRING MD :  Phillis Knack  CHIEF COMPLAINT:  fatigue  STUDIES:  Chest x-ray in May 2002 I have Independently reviewed images on 12/04/2016  Interpretation:no acute process no pneumonia no active disease    HISTORY OF PRESENT ILLNESS: Mr. Tetsuo Coppola is a 41 year old pleasant African-American male Is seen today for excessive daytime sleepiness   Patient has been having excessive daytime sleepiness Patient has been having extreme fatigue and tiredness, lack of energy Patient has been having fatigue for the last several years Patient has been told that he does not have real loud snoring  Patient weighs 319 pounds Patient does not exercise Patient does not smoke  Patient does not have any shortness of breath or chest pain at this time No palpitations noted No history of previous cardiac or lung disease noted  Patient has no signs of infection at this time Patient has no signs of congestive heart failure at this time    PAST MEDICAL HISTORY :   has a past medical history of Diabetes mellitus type II (2006); HLD (hyperlipidemia); HTN (hypertension); Obesity; and 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  has no past surgical history on file. Prior to Admission medications   Medication Sig Start Date End Date Taking? Authorizing Provider  atorvastatin (LIPITOR) 40 MG tablet Take 1 tablet (40 mg total) by mouth at bedtime. 11/11/16  Yes Ria Bush, MD  Blood Glucose Monitoring Suppl (ONE TOUCH ULTRA MINI) w/Device KIT Use as directed 11/05/16  Yes Ria Bush, MD  Cholecalciferol (VITAMIN D3) 50000 units TABS Take 1 tablet by mouth once a week. 11/23/16  Yes Ria Bush, MD  glimepiride (AMARYL) 4 MG tablet Take 1 tablet (4 mg total) by mouth daily. 11/11/16  Yes Ria Bush, MD  glucose blood (ONE TOUCH TEST STRIPS) test strip Check twice daily.  E11.9 11/05/16  Yes  Ria Bush, MD  losartan-hydrochlorothiazide (HYZAAR) 100-25 MG tablet Take 1 tablet by mouth daily. 11/11/16  Yes Ria Bush, MD  sitaGLIPtin (JANUVIA) 50 MG tablet Take 1 tablet (50 mg total) by mouth daily. 11/15/16  Yes Ria Bush, MD   No Known Allergies  FAMILY HISTORY:  family history includes Diabetes in his maternal grandmother and mother; Stroke in his maternal grandmother. SOCIAL HISTORY:  reports that he has never smoked. He has never used smokeless tobacco. He reports that he drinks alcohol. He reports that he does not use drugs.  REVIEW OF SYSTEMS:   Constitutional: Negative for fever, chills, weight loss, malaise/fatigue and diaphoresis.  HENT: Negative for hearing loss, ear pain, nosebleeds, congestion, sore throat, neck pain, tinnitus and ear discharge.   Eyes: Negative for blurred vision, double vision, photophobia, pain, discharge and redness.  Respiratory: Negative for cough, hemoptysis, sputum production, shortness of breath, wheezing and stridor.   Cardiovascular: Negative for chest pain, palpitations, orthopnea, claudication, leg swelling and PND.  Gastrointestinal: Negative for heartburn, nausea, vomiting, abdominal pain, diarrhea, constipation, blood in stool and melena.  Genitourinary: Negative for dysuria, urgency, frequency, hematuria and flank pain.  Musculoskeletal: Negative for myalgias, back pain, joint pain and falls.  Skin: Negative for itching and rash.  Neurological: Negative for dizziness, tingling, tremors, sensory change, speech change, focal weakness, seizures, loss of consciousness, weakness and headaches.  Endo/Heme/Allergies: Negative for environmental allergies and polydipsia. Does not bruise/bleed easily.  ALL OTHER ROS ARE NEGATIVE   BP 128/82 (BP Location: Left Arm, Cuff Size: Normal)   Pulse  87   Ht 5' 11"  (1.803 m)   Wt (!) 319 lb (144.7 kg)   SpO2 96%   BMI 44.49 kg/m    Physical Examination:   GENERAL:NAD, no  fevers, chills, no weakness no fatigue HEAD: Normocephalic, atraumatic.  EYES: Pupils equal, round, reactive to light. Extraocular muscles intact. No scleral icterus.  MOUTH: Moist mucosal membrane.   EAR, NOSE, THROAT: Clear without exudates. No external lesions.  NECK: Supple. No thyromegaly. No nodules. No JVD.  PULMONARY:CTA B/L no wheezes, no crackles, no rhonchi CARDIOVASCULAR: S1 and S2. Regular rate and rhythm. No murmurs, rubs, or gallops. No edema.  GASTROINTESTINAL: Soft, nontender, nondistended. No masses. Positive bowel sounds.  MUSCULOSKELETAL: No swelling, clubbing, or edema. Range of motion full in all extremities.  NEUROLOGIC: Cranial nerves II through XII are intact. No gross focal neurological deficits.  SKIN: No ulceration, lesions, rashes, or cyanosis. Skin warm and dry. Turgor intact.  PSYCHIATRIC: Mood, affect within normal limits. The patient is awake, alert and oriented x 3. Insight, judgment intact.       ASSESSMENT / PLAN: 41 year old morbidly obese African-American male seen today for excessive daytime sleepiness and after further evaluation and further assessment patient may have underlying sleep apnea  Will need to assess with sleep study  #1 excessive daytime sleepiness Obtain sleep study  #2 Obesity -recommend significant weight loss -recommend changing diet  #3 Deconditioned state -Recommend increased daily activity and exercise   Patient satisfied with Plan of action and management. All questions answered Follow-up after sleep study completed  Corrin Parker, M.D.  [facility]  Medical Director Reader Director [facility] Cardio-Pulmonary Department

## 2016-12-05 NOTE — Patient Instructions (Addendum)
Continue current medicines, start plain losartan 50mg  once daily for blood pressure.  Get blood work at local [facility] draw station next Saturday (August 4th). Prescription provided today.  Januvia refilled today.  Start vitamin D once weekly for 3 months.  Schedule eye exam when you can.

## 2016-12-05 NOTE — Assessment & Plan Note (Signed)
Chronic, pt reports compliance with lipitor 40mg  daily - continue.

## 2016-12-05 NOTE — Assessment & Plan Note (Signed)
Pending upcoming sleep study through  [facility].

## 2016-12-05 NOTE — Progress Notes (Signed)
BP (!) 152/86   Pulse 82   Temp 98.5 F (36.9 C) (Oral)   Wt (!) 320 lb 8 oz (145.4 kg)   SpO2 99%   BMI 44.70 kg/m    CC: f/u visit Subjective:    Patient ID: Jason Whitney, male    DOB: 1976/05/07, 41 y.o.   MRN: [id number]  HPI: Jason Whitney is a 41 y.o. male presenting on 12/05/2016 for Follow-up   See prior note for details. Recently found to have acute renal injury with Cr to 2.5 (GFR 32) (prior baseline ~1.1) - possibly from uncontrolled diabetes. We stopped janumet and started plain januvia 92m daily, continued glimepiride. We also stopped hyzaar. Recheck Cr dropped to 1.59 (GFR 62).   DM - checking sugars once or twice weekly, today's 2-hours post prandial 150. Compliant with januvia 582mdaily and glimepiride 58m42maily with breakfast.  Lab Results  Component Value Date   HGBA1C 11.0 (H) 11/11/2016     Saw Dr KasMortimer Friesplanning sleep study.   He has not started vit D 50,000 IU weekly for ongoing deficiency.   Jobs changing - starting new job on Monday. Requests upcoming labs at local [facility] drawing station on a Saturday.   Relevant past medical, surgical, family and social history reviewed and updated as indicated. Interim medical history since our last visit reviewed. Allergies and medications reviewed and updated. Outpatient Medications Prior to Visit  Medication Sig Dispense Refill  . atorvastatin (LIPITOR) 40 MG tablet Take 1 tablet (40 mg total) by mouth at bedtime. 90 tablet 3  . Blood Glucose Monitoring Suppl (ONE TOUCH ULTRA MINI) w/Device KIT Use as directed 1 each 0  . Cholecalciferol (VITAMIN D3) 50000 units TABS Take 1 tablet by mouth once a week. 12 tablet 1  . glimepiride (AMARYL) 4 MG tablet Take 1 tablet (4 mg total) by mouth daily. 90 tablet 3  . glucose blood (ONE TOUCH TEST STRIPS) test strip Check twice daily.  E11.9 60 each 12  . losartan-hydrochlorothiazide (HYZAAR) 100-25 MG tablet Take 1 tablet by mouth daily. 90 tablet 3  . sitaGLIPtin  (JANUVIA) 50 MG tablet Take 1 tablet (50 mg total) by mouth daily. 30 tablet 3   No facility-administered medications prior to visit.      Per HPI unless specifically indicated in ROS section below Review of Systems     Objective:    BP (!) 152/86   Pulse 82   Temp 98.5 F (36.9 C) (Oral)   Wt (!) 320 lb 8 oz (145.4 kg)   SpO2 99%   BMI 44.70 kg/m   Wt Readings from Last 3 Encounters:  12/05/16 (!) 320 lb 8 oz (145.4 kg)  12/05/16 (!) 319 lb (144.7 kg)  11/11/16 (!) 319 lb (144.7 kg)    Physical Exam  Constitutional: He appears well-developed and well-nourished. No distress.  HENT:  Mouth/Throat: Oropharynx is clear and moist. No oropharyngeal exudate.  Cardiovascular: Normal rate, regular rhythm, normal heart sounds and intact distal pulses.   No murmur heard. Pulmonary/Chest: Effort normal and breath sounds normal. No respiratory distress. He has no wheezes. He has no rales.  Musculoskeletal: He exhibits no edema.  Skin: Skin is warm and dry. No rash noted.  Psychiatric: He has a normal mood and affect.  Nursing note and vitals reviewed.  Results for orders placed or performed in visit on 11/21/16  Fructosamine  Result Value Ref Range   Fructosamine 343 (H) 0 - 285 umol/L  Renal  function panel  Result Value Ref Range   Glucose 78 65 - 99 mg/dL   BUN 23 (H) 6 - 20 mg/dL   Creatinine, Ser 1.59 (H) 0.76 - 1.27 mg/dL   GFR calc non Af Amer 54 (L) >59 mL/min/1.73   GFR calc Af Amer 63 >59 mL/min/1.73   BUN/Creatinine Ratio 14 9 - 20   Sodium 144 134 - 144 mmol/L   Potassium 4.3 3.5 - 5.2 mmol/L   Chloride 104 96 - 106 mmol/L   CO2 22 20 - 29 mmol/L   Calcium 9.9 8.7 - 10.2 mg/dL   Phosphorus 4.3 2.5 - 4.5 mg/dL   Albumin 4.4 3.5 - 5.5 g/dL  VITAMIN D 25 Hydroxy (Vit-D Deficiency, Fractures)  Result Value Ref Range   Vit D, 25-Hydroxy 13.1 (L) 30.0 - 100.0 ng/mL  Serum protein electrophoresis with reflex  Result Value Ref Range   Total Protein 8.1 6.0 - 8.5  g/dL   Albumin ELP 3.5 2.9 - 4.4 g/dL   Alpha 1 0.2 0.0 - 0.4 g/dL   Alpha 2 1.1 (H) 0.4 - 1.0 g/dL   Beta 1.6 (H) 0.7 - 1.3 g/dL   Gamma Globulin 1.7 0.4 - 1.8 g/dL   M-Spike, % Not Observed Not Observed g/dL   GLOBULIN, TOTAL 4.6 (H) 2.2 - 3.9 g/dL   A/G Ratio 0.8 0.7 - 1.7   Please Note: Comment    Interpretation(See Below) Comment   CBC with Differential/Platelet  Result Value Ref Range   WBC 15.4 (H) 3.4 - 10.8 x10E3/uL   RBC 4.73 4.14 - 5.80 x10E6/uL   Hemoglobin 13.3 13.0 - 17.7 g/dL   Hematocrit 38.8 37.5 - 51.0 %   MCV 82 79 - 97 fL   MCH 28.1 26.6 - 33.0 pg   MCHC 34.3 31.5 - 35.7 g/dL   RDW 13.9 12.3 - 15.4 %   Platelets 342 150 - 379 x10E3/uL   Neutrophils 62 Not Estab. %   Lymphs 29 Not Estab. %   Monocytes 8 Not Estab. %   Eos 1 Not Estab. %   Basos 0 Not Estab. %   Neutrophils Absolute 9.6 (H) 1.4 - 7.0 x10E3/uL   Lymphocytes Absolute 4.4 (H) 0.7 - 3.1 x10E3/uL   Monocytes Absolute 1.2 (H) 0.1 - 0.9 x10E3/uL   EOS (ABSOLUTE) 0.1 0.0 - 0.4 x10E3/uL   Basophils Absolute 0.0 0.0 - 0.2 x10E3/uL   Immature Granulocytes 0 Not Estab. %   Immature Grans (Abs) 0.0 0.0 - 0.1 x10E3/uL      Assessment & Plan:  Over 25 minutes were spent face-to-face with the patient during this encounter and >50% of that time was spent on counseling and coordination of care  Problem List Items Addressed This Visit    Diabetes mellitus type 2, uncontrolled ([place])    Chronically uncontrolled. He is checking sugars twice weekly. I advised closer monitoring. Continue current regimen of januvia 75m and amaryl 434mdaily. RTC 3 mo f/u visit.       Relevant Medications   sitaGLIPtin (JANUVIA) 50 MG tablet   losartan (COZAAR) 50 MG tablet   Essential hypertension (Chronic)    BP elevated today (recently stopped losartan hctz 100/25). Will restart losartan 509maily and recheck Cr in 10 days.       Relevant Medications   losartan (COZAAR) 50 MG tablet   HLD (hyperlipidemia) (Chronic)     Chronic, pt reports compliance with lipitor 70m53mily - continue.  Relevant Medications   losartan (COZAAR) 50 MG tablet   Leukocytosis    Mild elevation this month - without signs of infection. SPEP with evidence of mild inflammation as well. Will continue to monitor.       Renal insufficiency - Primary    Acute renal insufficiency anticipate related to diabetic nephropathy. Discussed this with patient, reviewed importance of tighter glycemic control. Recent fructosamine levels were improved compared to prior A1c. Will continue off metformin, continue lower januvia and lower losartan regimen (off hctz). I have asked him to get labs drawn in 10 days (as we're restarting losartan 46m) at Saturday [facility] draw station       Severe obesity (BMI >= 40) (H[facility])    Pending upcoming sleep study through [facility]      Relevant Medications   sitaGLIPtin (JANUVIA) 50 MG tablet   Vitamin D deficiency    He will pick up 50k weekly dose.           Follow up plan: Return in about 2 months (around 02/11/2017) for follow up visit.  JRia Bush MD

## 2016-12-05 NOTE — Assessment & Plan Note (Signed)
BP elevated today (recently stopped losartan hctz 100/25). Will restart losartan 50mg  daily and recheck Cr in 10 days.

## 2016-12-17 ENCOUNTER — Ambulatory Visit: Payer: 59 | Attending: Internal Medicine

## 2016-12-17 ENCOUNTER — Encounter: Payer: Self-pay | Admitting: Internal Medicine

## 2016-12-17 DIAGNOSIS — G4710 Hypersomnia, unspecified: Secondary | ICD-10-CM | POA: Insufficient documentation

## 2016-12-17 DIAGNOSIS — G4733 Obstructive sleep apnea (adult) (pediatric): Secondary | ICD-10-CM | POA: Insufficient documentation

## 2016-12-17 DIAGNOSIS — G4719 Other hypersomnia: Secondary | ICD-10-CM

## 2016-12-20 DIAGNOSIS — G4733 Obstructive sleep apnea (adult) (pediatric): Secondary | ICD-10-CM

## 2016-12-23 ENCOUNTER — Telehealth: Payer: Self-pay | Admitting: *Deleted

## 2016-12-23 DIAGNOSIS — G4733 Obstructive sleep apnea (adult) (pediatric): Secondary | ICD-10-CM

## 2016-12-23 NOTE — Addendum Note (Signed)
Addended by: Alease FrameARTER, SONYA S on: 12/23/2016 04:09 PM   Modules accepted: Orders

## 2016-12-23 NOTE — Telephone Encounter (Signed)
Patient aware of results of sleep study. Orders entered. Nothing further needed.

## 2016-12-23 NOTE — Telephone Encounter (Signed)
LMTCB

## 2017-02-03 ENCOUNTER — Telehealth: Payer: Self-pay | Admitting: Internal Medicine

## 2017-02-03 NOTE — Telephone Encounter (Signed)
Results attempted to be sent. Pt called back and was not able to view results. Results will be picked and faxed per patient request. Nothing further needed.

## 2017-02-03 NOTE — Telephone Encounter (Signed)
Results attempted to be sent. Pt called back and was not able to view results. Results will be picked and faxed per patient request. Nothing further needed.  

## 2017-02-03 NOTE — Telephone Encounter (Signed)
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  Also states we can fax results to [phone]

## 2017-02-03 NOTE — Telephone Encounter (Signed)
Pt calling asking if we may release his sleep study results in mychart  Please advise.

## 2017-02-13 ENCOUNTER — Encounter: Payer: 59 | Admitting: Family Medicine

## 2017-02-26 ENCOUNTER — Ambulatory Visit: Payer: 59 | Admitting: Family Medicine

## 2017-03-07 ENCOUNTER — Ambulatory Visit: Payer: 59 | Admitting: Internal Medicine

## 2017-03-08 LAB — HM DIABETES EYE EXAM

## 2017-03-19 ENCOUNTER — Telehealth: Payer: Self-pay | Admitting: Internal Medicine

## 2017-03-19 NOTE — Telephone Encounter (Signed)
Please call pt regarding him receiving his CPAP machine

## 2017-03-19 NOTE — Telephone Encounter (Signed)
Called and spoke with patient and he stated that he would like order for CPAP sent to DME company. Pt advised that he does have insurance now and would like to get this process started.  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 Pt will need to fax or bring insurance card to us so we will have to send with the order. Asked patient if using [employer] as DME would be ok, since they are a [employer] and [place] in case he has issues with device on the road. Pt stated that would be fine. Once patient gets the insurance card to us, I will fax Order and study to [facility].  Pt is aware that device can not be ordered until insurance card is received. Pt will either have wife bring card or will fax card to me.  Pt is aware of process. Rhonda J Cobb

## 2017-03-19 NOTE — Telephone Encounter (Signed)
LMTCB x 1 

## 2017-03-30 ENCOUNTER — Encounter: Payer: Self-pay | Admitting: Family Medicine

## 2017-05-31 DIAGNOSIS — G4733 Obstructive sleep apnea (adult) (pediatric): Secondary | ICD-10-CM | POA: Diagnosis not present

## 2017-06-13 ENCOUNTER — Other Ambulatory Visit: Payer: Self-pay | Admitting: Family Medicine

## 2017-07-25 ENCOUNTER — Telehealth: Payer: Self-pay | Admitting: Family Medicine

## 2017-07-25 NOTE — Telephone Encounter (Signed)
Spoke with pt informing him typically an OV is needed for evaluation.  Says he has a cough, HA, nasal drainage, minor nausea and some achiness.  Denies any fever, vomiting or diarrhea.  Says symptoms started about 2 days ago.  Asks if he cannot get Tamiflu, if Dr. Reece AgarG can recommend something.  Gives permission go lvm if no answer when calling back.

## 2017-07-25 NOTE — Telephone Encounter (Signed)
Agree doesn't sound like flu if no high fevers. Recommend OTC cough medicine like delsym or dimetapp, and may also use plain mucinex if feels congestion/mucous in head or chest. Push fluids and plenty of rest. Let us know if not improving with treatment.

## 2017-07-25 NOTE — Telephone Encounter (Signed)
Copied from CRM [phone]. Topic: General - Other >> Jul 25, 2017 10:59 AM Cecelia ByarsGreen, Temeka L, RMA wrote: Reason for CRM: [facility], pt states he has flu symptoms

## 2017-07-25 NOTE — Telephone Encounter (Signed)
Spoke with pt relaying message. Pt verbalizes understanding and will try recommendations. Expresses his thanks.

## 2017-07-29 DIAGNOSIS — G4733 Obstructive sleep apnea (adult) (pediatric): Secondary | ICD-10-CM | POA: Diagnosis not present

## 2017-08-29 DIAGNOSIS — G4733 Obstructive sleep apnea (adult) (pediatric): Secondary | ICD-10-CM | POA: Diagnosis not present

## 2017-09-28 DIAGNOSIS — G4733 Obstructive sleep apnea (adult) (pediatric): Secondary | ICD-10-CM | POA: Diagnosis not present

## 2017-10-05 ENCOUNTER — Other Ambulatory Visit: Payer: Self-pay | Admitting: Family Medicine

## 2017-10-07 NOTE — Telephone Encounter (Signed)
Electronic refill request Last refill 06/13/17, note sent to pharmacy needs to schedule appointment last office visit 12/05/16, was to follow-up in two months Has cancelled several appointments No upcoming appointment scheduled

## 2017-10-29 DIAGNOSIS — G4733 Obstructive sleep apnea (adult) (pediatric): Secondary | ICD-10-CM | POA: Diagnosis not present

## 2017-11-16 ENCOUNTER — Encounter: Payer: Self-pay | Admitting: Physician Assistant

## 2017-11-17 ENCOUNTER — Other Ambulatory Visit: Payer: Self-pay

## 2017-11-17 ENCOUNTER — Ambulatory Visit: Payer: Self-pay | Admitting: Physician Assistant

## 2017-11-17 ENCOUNTER — Encounter: Payer: Self-pay | Admitting: Physician Assistant

## 2017-11-17 VITALS — BP 138/82 | HR 105 | Temp 98.6°F | Resp 16 | Ht 71.0 in | Wt 309.2 lb

## 2017-11-17 DIAGNOSIS — E11649 Type 2 diabetes mellitus with hypoglycemia without coma: Secondary | ICD-10-CM

## 2017-11-17 DIAGNOSIS — Z0289 Encounter for other administrative examinations: Secondary | ICD-10-CM

## 2017-11-17 LAB — POCT GLYCOSYLATED HEMOGLOBIN (HGB A1C): Hemoglobin A1C: 10.1 % — AB (ref 4.0–5.6)

## 2017-11-17 NOTE — Progress Notes (Signed)
   Airline pilotCommercial Driver Medical Examination   Jason Whitney is a 42 y.o. male with a pertinent medical history of OSA and poorly controlled diabetes who presents today for a commercial driver fitness determination physical exam. The patient reports no problems today. He denies focal neurological deficits, vision and hearing changes. He denies the habitual use of benzodiazepines, opioids, amphetamines and denies illicit drug use.   Current medications, family history, allergies, social history reviewed by me and exist elsewhere in the encounter.   Review of Systems  Constitutional: Negative for chills, diaphoresis and fever.  Eyes: Negative.   Respiratory: Negative for cough, hemoptysis, sputum production, shortness of breath and wheezing.   Cardiovascular: Negative for chest pain, orthopnea and leg swelling.  Gastrointestinal: Negative for abdominal pain, blood in stool, constipation, diarrhea, heartburn, melena, nausea and vomiting.  Genitourinary: Negative for dysuria, flank pain, frequency, hematuria and urgency.  Skin: Negative for rash.  Neurological: Negative for dizziness, sensory change, speech change, focal weakness and headaches.    Objective:     Vision/hearing:  Visual Acuity Screening   Right eye Left eye Both eyes  Without correction: 20/25 20/25 20/25   With correction:     Comments: 85 peripheral   Hearing Screening Comments: 10 feet whisper   Applicant can recognize and distinguish among traffic control signals and devices showing standard red, green, and amber colors.  Corrective lenses required: No  Monocular Vision?: No  Hearing aid requirement: No  Physical Exam  Constitutional: He is oriented to person, place, and time. He appears well-developed. He is active.  Non-toxic appearance. He does not appear ill.  Eyes: Pupils are equal, round, and reactive to light. Conjunctivae and EOM are normal.  Cardiovascular: Normal rate, regular rhythm, S1 normal, S2 normal,  normal heart sounds, intact distal pulses and normal pulses. Exam reveals no gallop and no friction rub.  No murmur heard. Pulmonary/Chest: Effort normal. No stridor. No respiratory distress. He has no wheezes. He has no rales.  Abdominal: He exhibits no distension.  Musculoskeletal: Normal range of motion. He exhibits no edema.  Neurological: He is alert and oriented to person, place, and time. No cranial nerve deficit. Coordination normal.  Skin: Skin is warm and dry. He is not diaphoretic. No pallor.  Psychiatric: He has a normal mood and affect.  Nursing note and vitals reviewed.   BP 138/82   Pulse (!) 105   Temp 98.6 F (37 C)   Resp 16   Ht 5\' 11"  (1.803 m)   Wt (!) 309 lb 3.2 oz (140.3 kg)   SpO2 99%   BMI 43.12 kg/m   Labs: Comments: UA- specific gravity 1.025, protein-100++, blood-small, glucose-negative Lab Results  Component Value Date   HGBA1C 10.1 (A) 11/17/2017     Assessment:    Healthy male exam.  Meets standards, but periodic monitoring required due to uncontrolled diabetes.  Driver qualified only for 3 months.    Plan:    Medical examiners certificate completed and printed. Return as needed.    Deliah BostonMichael Kyree Fedorko, MS, PA-C 1:23 PM, 11/17/2017

## 2017-11-28 DIAGNOSIS — G4733 Obstructive sleep apnea (adult) (pediatric): Secondary | ICD-10-CM | POA: Diagnosis not present

## 2017-12-09 ENCOUNTER — Other Ambulatory Visit: Payer: Self-pay | Admitting: Family Medicine

## 2017-12-15 ENCOUNTER — Other Ambulatory Visit: Payer: Self-pay | Admitting: Family Medicine

## 2017-12-15 MED ORDER — SITAGLIPTIN PHOSPHATE 50 MG PO TABS: 50.0000 mg | ORAL_TABLET | Freq: Every day | ORAL | 0 refills | Status: DC

## 2017-12-15 NOTE — Telephone Encounter (Signed)
Patient is requesting a 90 day supply of medication for insurance purposes. Januvia 50 mg- 90 day supply  PCP: Sharen HonesGutierrez  Pharmacy: verified

## 2017-12-15 NOTE — Telephone Encounter (Signed)
Copied from CRM [id number]. Topic: Quick Communication - Rx Refill/Question >> Dec 15, 2017 11:33 AM Luanna Coleawoud, Jessica L wrote: Medication:JANUVIA 50 MG tablet [[id number]][[id number]] insurance requiring 90day supply   Has the patient contacted their pharmacy? Yes  Preferred Pharmacy (with phone number or street name): [id number] [place] [place] - [address] [phone] (Phone) [phone] (Fax)   Agent: Please be advised that RX refills may take up to 3 business days. We ask that you follow-up with your pharmacy.

## 2017-12-29 DIAGNOSIS — G4733 Obstructive sleep apnea (adult) (pediatric): Secondary | ICD-10-CM | POA: Diagnosis not present

## 2018-01-09 ENCOUNTER — Encounter: Payer: Self-pay | Admitting: Family Medicine

## 2018-01-09 ENCOUNTER — Ambulatory Visit (INDEPENDENT_AMBULATORY_CARE_PROVIDER_SITE_OTHER): Payer: 59 | Admitting: Family Medicine

## 2018-01-09 ENCOUNTER — Other Ambulatory Visit: Payer: Self-pay | Admitting: Family Medicine

## 2018-01-09 VITALS — BP 144/90 | HR 87 | Temp 98.6°F | Ht 71.0 in | Wt 312.2 lb

## 2018-01-09 DIAGNOSIS — Z91199 Patient's noncompliance with other medical treatment and regimen due to unspecified reason: Secondary | ICD-10-CM

## 2018-01-09 DIAGNOSIS — E113293 Type 2 diabetes mellitus with mild nonproliferative diabetic retinopathy without macular edema, bilateral: Secondary | ICD-10-CM | POA: Diagnosis not present

## 2018-01-09 DIAGNOSIS — Z9119 Patient's noncompliance with other medical treatment and regimen: Secondary | ICD-10-CM

## 2018-01-09 DIAGNOSIS — E785 Hyperlipidemia, unspecified: Secondary | ICD-10-CM | POA: Diagnosis not present

## 2018-01-09 DIAGNOSIS — I10 Essential (primary) hypertension: Secondary | ICD-10-CM | POA: Diagnosis not present

## 2018-01-09 DIAGNOSIS — N289 Disorder of kidney and ureter, unspecified: Secondary | ICD-10-CM | POA: Diagnosis not present

## 2018-01-09 MED ORDER — SITAGLIPTIN PHOSPHATE 50 MG PO TABS: 50.0000 mg | ORAL_TABLET | Freq: Every day | ORAL | 1 refills | Status: DC

## 2018-01-09 MED ORDER — GLUCOSE BLOOD VI STRP: ORAL_STRIP | 3 refills | Status: DC

## 2018-01-09 MED ORDER — LOSARTAN POTASSIUM 50 MG PO TABS: 50.0000 mg | ORAL_TABLET | Freq: Every day | ORAL | 1 refills | Status: DC

## 2018-01-09 MED ORDER — GLIMEPIRIDE 4 MG PO TABS: 4.0000 mg | ORAL_TABLET | Freq: Every day | ORAL | 1 refills | Status: DC

## 2018-01-09 MED ORDER — ATORVASTATIN CALCIUM 40 MG PO TABS: 40.0000 mg | ORAL_TABLET | Freq: Every day | ORAL | 1 refills | Status: DC

## 2018-01-09 NOTE — Assessment & Plan Note (Signed)
BP remains elevated. Unclear if compliant, I'm hesitant to make any changes. Losartan 50mg  refilled. Reassess at f/u visit.

## 2018-01-09 NOTE — Patient Instructions (Addendum)
Eye exam will be due in October Labs today.  Medicines refilled today.  Try to take medicines and try to schedule meals on a routine - in diabetes, your body does better with a routine.  Choose low glycemic index fruits: Cherries Grapefruit Dried apricots Pears Apples Oranges Plums Strawberries Peaches Grapes

## 2018-01-09 NOTE — Assessment & Plan Note (Signed)
Update Cr. H/o AKI presumed due to diabetes.

## 2018-01-09 NOTE — Assessment & Plan Note (Addendum)
Chronically poorly controlled. Reviewed last several years of labs - he has only come in once yearly despite repeated requests for more frequent monitoring. Had frank discussion about risks of uncontrolled diabetes leading to stroke, MI, amputations, blindness, kidney failure. I advised he needed to take sugar control seriously in order to prevent these complications. He reports taking medications and only missing a few doses, but I don't feel comfortable changing any medications at this time without more regular follow up. He states he will return for follow up in 1 month with sugar log and we will review readings and titrate accordingly at that time.  Asks about recommended fruits to eat. Provided with handout on glycemic load and glycemic index - with list of 10 preferred fruits.

## 2018-01-09 NOTE — Progress Notes (Signed)
BP (!) 144/90 (BP Location: Right Arm, Patient Position: Sitting, Cuff Size: Large)   Pulse 87   Temp 98.6 F (37 C) (Oral)   Ht _0  (1.803 m)   Wt (!) 312 lb 4 oz (141.6 kg)   SpO2 98%   BMI 43.55 kg/m    CC: 6 wk DM f/u visit Subjective:    Patient ID: Jason Whitney, male    DOB: 1975-09-10, 42 y.o.   MRN: [id number]  HPI: Jason Whitney is a 42 y.o. male presenting on 01/09/2018 for Diabetes (Here for f/u. Last seen 11/17/17.)   Last seen here 11/2016. Last year had AKI Cr 2.5 possibly from uncontrolled diabetes. We stopped metformin. He did not return for 42mof/u.   He states he's only missed a few days of his blood pressure   Very varied schedule, only eats 1 meal a day.   HTN - was on losartan 59mdaily. No HA, vision changes, CP/tightness, SOB, leg swelling.   DM - does not regularly check sugars. Last sugar 119 fasting last week. Was on amaryl 32m40maily and januvia 15m5mily. Denies hypoglycemic symptoms. Denies paresthesias. Last diabetic eye exam 02/2017. Pneumovax: 2014. Prevnar: not due. Glucometer brand: one-touch. [place] Results  Component Value Date   HGBA1C 10.1 (A) 11/17/2017   Diabetic Foot Exam - Simple   Simple Foot Form Diabetic Foot exam was performed with the following findings:  Yes 01/09/2018  4:14 PM  Visual Inspection No deformities, no ulcerations, no other skin breakdown bilaterally:  Yes Sensation Testing Intact to touch and monofilament testing bilaterally:  Yes Pulse Check Posterior Tibialis and Dorsalis pulse intact bilaterally:  Yes Comments    Lab Results  Component Value Date   MICROALBUR 2.5 (H) 11/30/2012     Relevant past medical, surgical, family and social history reviewed and updated as indicated. Interim medical history since our last visit reviewed. Allergies and medications reviewed and updated. Outpatient Medications Prior to Visit  Medication Sig Dispense Refill  . Blood Glucose Monitoring  Suppl (ONE TOUCH ULTRA MINI) w/Device KIT Use as directed 1 each 0  . Cholecalciferol (VITAMIN D3) 50000 units TABS Take 1 tablet by mouth once a week. 12 tablet 1  . atorvastatin (LIPITOR) 40 MG tablet Take 1 tablet (40 mg total) by mouth at bedtime. 90 tablet 3  . glimepiride (AMARYL) 4 MG tablet Take 1 tablet (4 mg total) by mouth daily. 90 tablet 3  . glucose blood (ONE TOUCH TEST STRIPS) test strip Check twice daily.  E11.9 60 each 12  . losartan (COZAAR) 50 MG tablet TAKE 1 TABLET BY MOUTH EVERY DAY 30 tablet 3  . sitaGLIPtin (JANUVIA) 50 MG tablet Take 1 tablet (50 mg total) by mouth daily. 90 tablet 0   No facility-administered medications prior to visit.      Per HPI unless specifically indicated in ROS section below Review of Systems     Objective:    BP (!) 144/90 (BP Location: Right Arm, Patient Position: Sitting, Cuff Size: Large)   Pulse 87   Temp 98.6 F (37 C) (Oral)   Ht _1  (1.803 m)   Wt (!) 312 lb 4 oz (141.6 kg)   SpO2 98%   BMI 43.55 kg/m   Wt Readings from Last 3 Encounters:  01/09/18 (!) 312 lb 4 oz (141.6 kg)  11/17/17 (!) 309 lb 3.2 oz (140.3 kg)  12/05/16 (!) 320 lb 8 oz (145.4 kg)  Physical Exam  Constitutional: He appears well-developed and well-nourished. No distress.  HENT:  Head: Normocephalic and atraumatic.  Right Ear: External ear normal.  Left Ear: External ear normal.  Nose: Nose normal.  Mouth/Throat: Oropharynx is clear and moist. No oropharyngeal exudate.  Eyes: Pupils are equal, round, and reactive to light. Conjunctivae and EOM are normal. No scleral icterus.  Neck: Normal range of motion. Neck supple.  Cardiovascular: Normal rate, regular rhythm, normal heart sounds and intact distal pulses.  No murmur heard. Pulmonary/Chest: Effort normal and breath sounds normal. No respiratory distress. He has no wheezes. He has no rales.  Musculoskeletal: He exhibits no edema.  See HPI for foot exam if done  Lymphadenopathy:    He has  no cervical adenopathy.  Skin: Skin is warm and dry. No rash noted.  Psychiatric: He has a normal mood and affect.  Nursing note and vitals reviewed.    Results for orders placed or performed in visit on 11/17/17  POCT glycosylated hemoglobin (Hb A1C)  Result Value Ref Range   Hemoglobin A1C 10.1 (A) 4.0 - 5.6 %   HbA1c POC (<> result, manual entry)  4.0 - 5.6 %   HbA1c, POC (prediabetic range)  5.7 - 6.4 %   HbA1c, POC (controlled diabetic range)  0.0 - 7.0 %      Assessment & Plan:  Declines flu shot today. Problem List Items Addressed This Visit    Type 2 diabetes mellitus with mild nonproliferative diabetic retinopathy without macular edema, bilateral ([place]) - Primary    Chronically poorly controlled. Reviewed last several years of labs - he has only come in once yearly despite repeated requests for more frequent monitoring. Had frank discussion about risks of uncontrolled diabetes leading to stroke, MI, amputations, blindness, kidney failure. I advised he needed to take sugar control seriously in order to prevent these complications. He reports taking medications and only missing a few doses, but I don't feel comfortable changing any medications at this time without more regular follow up. He states he will return for follow up in 1 month with sugar log and we will review readings and titrate accordingly at that time.  Asks about recommended fruits to eat. Provided with handout on glycemic load and glycemic index - with list of 10 preferred fruits.       Relevant Medications   atorvastatin (LIPITOR) 40 MG tablet   glimepiride (AMARYL) 4 MG tablet   losartan (COZAAR) 50 MG tablet   sitaGLIPtin (JANUVIA) 50 MG tablet   Other Relevant Orders   Fructosamine   Comprehensive metabolic panel   LDL cholesterol, direct   Renal insufficiency    Update Cr. H/o AKI presumed due to diabetes.       Patient non adherence   HLD (hyperlipidemia) (Chronic)    Reports compliance with  atorvastatin. Unclear. Update LDL.       Relevant Medications   atorvastatin (LIPITOR) 40 MG tablet   losartan (COZAAR) 50 MG tablet   Essential hypertension (Chronic)    BP remains elevated. Unclear if compliant, I'm hesitant to make any changes. Losartan 82m refilled. Reassess at f/u visit.       Relevant Medications   atorvastatin (LIPITOR) 40 MG tablet   losartan (COZAAR) 50 MG tablet       Meds ordered this encounter  Medications  . atorvastatin (LIPITOR) 40 MG tablet    Sig: Take 1 tablet (40 mg total) by mouth at bedtime.    Dispense:  90 tablet  Refill:  1  . glimepiride (AMARYL) 4 MG tablet    Sig: Take 1 tablet (4 mg total) by mouth daily.    Dispense:  90 tablet    Refill:  1  . glucose blood (ONE TOUCH TEST STRIPS) test strip    Sig: Check twice daily.  E11.9    Dispense:  200 each    Refill:  3  . losartan (COZAAR) 50 MG tablet    Sig: Take 1 tablet (50 mg total) by mouth daily.    Dispense:  90 tablet    Refill:  1  . sitaGLIPtin (JANUVIA) 50 MG tablet    Sig: Take 1 tablet (50 mg total) by mouth daily.    Dispense:  90 tablet    Refill:  1   Orders Placed This Encounter  Procedures  . Fructosamine  . Comprehensive metabolic panel  . LDL cholesterol, direct    Follow up plan: Return in about 4 weeks (around 02/06/2018) for follow up visit.  Ria Bush, MD

## 2018-01-09 NOTE — Assessment & Plan Note (Signed)
Reports compliance with atorvastatin. Unclear. Update LDL.

## 2018-01-13 LAB — COMPREHENSIVE METABOLIC PANEL
AG RATIO: 1.1 (calc) (ref 1.0–2.5)
ALKALINE PHOSPHATASE (APISO): 91 U/L (ref 40–115)
ALT: 16 U/L (ref 9–46)
AST: 14 U/L (ref 10–40)
Albumin: 4.1 g/dL (ref 3.6–5.1)
BILIRUBIN TOTAL: 1.3 mg/dL — AB (ref 0.2–1.2)
BUN: 14 mg/dL (ref 7–25)
CO2: 29 mmol/L (ref 20–32)
Calcium: 9.3 mg/dL (ref 8.6–10.3)
Chloride: 104 mmol/L (ref 98–110)
Creat: 1.14 mg/dL (ref 0.60–1.35)
Globulin: 3.7 g/dL (calc) (ref 1.9–3.7)
Glucose, Bld: 116 mg/dL — ABNORMAL HIGH (ref 65–99)
POTASSIUM: 4.1 mmol/L (ref 3.5–5.3)
Sodium: 140 mmol/L (ref 135–146)
Total Protein: 7.8 g/dL (ref 6.1–8.1)

## 2018-01-13 LAB — FRUCTOSAMINE: Fructosamine: 381 umol/L — ABNORMAL HIGH (ref 190–270)

## 2018-01-13 LAB — LDL CHOLESTEROL, DIRECT: LDL DIRECT: 123 mg/dL — AB (ref <100)

## 2018-01-17 ENCOUNTER — Other Ambulatory Visit: Payer: Self-pay | Admitting: Family Medicine

## 2018-02-10 ENCOUNTER — Ambulatory Visit: Payer: 59 | Admitting: Family Medicine

## 2018-02-10 ENCOUNTER — Encounter: Payer: Self-pay | Admitting: Family Medicine

## 2018-02-10 VITALS — BP 156/92 | HR 95 | Temp 98.8°F | Ht 71.0 in | Wt 312.5 lb

## 2018-02-10 DIAGNOSIS — E113293 Type 2 diabetes mellitus with mild nonproliferative diabetic retinopathy without macular edema, bilateral: Secondary | ICD-10-CM

## 2018-02-10 DIAGNOSIS — IMO0002 Reserved for concepts with insufficient information to code with codable children: Secondary | ICD-10-CM

## 2018-02-10 DIAGNOSIS — I10 Essential (primary) hypertension: Secondary | ICD-10-CM

## 2018-02-10 DIAGNOSIS — E118 Type 2 diabetes mellitus with unspecified complications: Secondary | ICD-10-CM | POA: Diagnosis not present

## 2018-02-10 DIAGNOSIS — E1169 Type 2 diabetes mellitus with other specified complication: Secondary | ICD-10-CM | POA: Insufficient documentation

## 2018-02-10 DIAGNOSIS — E559 Vitamin D deficiency, unspecified: Secondary | ICD-10-CM

## 2018-02-10 DIAGNOSIS — E785 Hyperlipidemia, unspecified: Secondary | ICD-10-CM

## 2018-02-10 DIAGNOSIS — E1165 Type 2 diabetes mellitus with hyperglycemia: Secondary | ICD-10-CM | POA: Insufficient documentation

## 2018-02-10 DIAGNOSIS — G4733 Obstructive sleep apnea (adult) (pediatric): Secondary | ICD-10-CM

## 2018-02-10 LAB — POCT GLYCOSYLATED HEMOGLOBIN (HGB A1C): Hemoglobin A1C: 10.7 % — AB (ref 4.0–5.6)

## 2018-02-10 MED ORDER — METFORMIN HCL 500 MG PO TABS: 500.0000 mg | ORAL_TABLET | Freq: Two times a day (BID) | ORAL | 1 refills | Status: DC

## 2018-02-10 MED ORDER — VITAMIN D3 1.25 MG (50000 UT) PO TABS: 1.0000 | ORAL_TABLET | ORAL | 1 refills | Status: DC

## 2018-02-10 MED ORDER — SITAGLIPTIN PHOSPHATE 100 MG PO TABS: 100.0000 mg | ORAL_TABLET | Freq: Every day | ORAL | 1 refills | Status: DC

## 2018-02-10 NOTE — Progress Notes (Signed)
BP (!) 156/92 (BP Location: Right Arm, Cuff Size: Large)   Pulse 95   Temp 98.8 F (37.1 C) (Oral)   Ht 5' 11"  (1.803 m)   Wt (!) 312 lb 8 oz (141.7 kg)   SpO2 98%   BMI 43.58 kg/m    CC: DM f/u visit Subjective:    Patient ID: Jason Whitney, male    DOB: [birth year], 42 y.o.   MRN: [id number]  HPI: Jason Whitney is a 42 y.o. 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   OSA - on CPAP, struggling with its use.   HLD - compliant with atorvastatin without myalgias.   HTN - Compliant with current antihypertensive regimen of losartan 77m daily. Does not check blood pressures at home - needs to buy new cuff. No low blood pressure readings or symptoms of dizziness/syncope. Denies HA, vision changes, CP/tightness, SOB, leg swelling.   DM - does regularly check sugars 115-165 fasting. Left meter in hotel last week - planning to pick up next week. Compliant with antihyperglycemic regimen which includes: amaryl 49mdaily, januvia 5039maily. Denies low sugars or hypoglycemic symptoms. Denies paresthesias. Last diabetic eye exam: 02/2017, upcoming appt 03/2018. Pneumovax: 2014. Prevnar: not due. Glucometer brand: one touch meter. DSME: has not completed - would need Mondays.  Lab Results  Component Value Date   HGBA1C 10.7 (A) 02/10/2018   Diabetic Foot Exam - Simple   No data filed     Lab Results  Component Value Date   MICROALBUR 2.5 (H) 11/30/2012     Relevant past medical, surgical, family and social history reviewed and updated as indicated. Interim medical history since our last visit reviewed. Allergies and medications reviewed and updated. Outpatient Medications Prior to Visit  Medication Sig Dispense Refill  . atorvastatin (LIPITOR) 40 MG tablet Take 1 tablet (40 mg total) by mouth at bedtime. 90 tablet 1  . Blood Glucose Monitoring Suppl (ONE TOUCH ULTRA MINI) w/Device KIT  Use as directed 1 each 0  . glimepiride (AMARYL) 4 MG tablet Take 1 tablet (4 mg total) by mouth daily. 90 tablet 1  . glucose blood (ONE TOUCH ULTRA TEST) test strip CHECK TWICE DAILY 200 each 3  . losartan (COZAAR) 50 MG tablet Take 1 tablet (50 mg total) by mouth daily. 90 tablet 1  . Cholecalciferol (VITAMIN D3) 50000 units TABS Take 1 tablet by mouth once a week. 12 tablet 1  . sitaGLIPtin (JANUVIA) 50 MG tablet Take 1 tablet (50 mg total) by mouth daily. 90 tablet 1   No facility-administered medications prior to visit.      Per HPI unless specifically indicated in ROS section below Review of Systems     Objective:    BP (!) 156/92 (BP Location: Right Arm, Cuff Size: Large)   Pulse 95   Temp 98.8 F (37.1 C) (Oral)   Ht 5' 11"  (1.803 m)   Wt (!) 312 lb 8 oz (141.7 kg)   SpO2 98%   BMI 43.58 kg/m   Wt Readings from Last 3 Encounters:  02/10/18 (!) 312 lb 8 oz (141.7 kg)  01/09/18 (!) 312 lb 4 oz (141.6 kg)  11/17/17 (!) 309 lb 3.2 oz (140.3 kg)    Physical Exam  Constitutional: He appears well-developed and well-nourished. No distress.  HENT:  Head: Normocephalic and atraumatic.  Right Ear: External ear normal.  Left Ear:  External ear normal.  Nose: Nose normal.  Mouth/Throat: Oropharynx is clear and moist. No oropharyngeal exudate.  Eyes: Pupils are equal, round, and reactive to light. Conjunctivae and EOM are normal. No scleral icterus.  Neck: Normal range of motion. Neck supple.  Cardiovascular: Normal rate, regular rhythm, normal heart sounds and intact distal pulses.  No murmur heard. Pulmonary/Chest: Effort normal and breath sounds normal. No respiratory distress. He has no wheezes. He has no rales.  Musculoskeletal: He exhibits no edema.  See HPI for foot exam if done  Lymphadenopathy:    He has no cervical adenopathy.  Skin: Skin is warm and dry. No rash noted.  Psychiatric: He has a normal mood and affect.  Nursing note and vitals reviewed.  Results  for orders placed or performed in visit on 02/10/18  POCT glycosylated hemoglobin (Hb A1C)  Result Value Ref Range   Hemoglobin A1C 10.7 (A) 4.0 - 5.6 %   HbA1c POC (<> result, manual entry)     HbA1c, POC (prediabetic range)     HbA1c, POC (controlled diabetic range)     Lab Results  Component Value Date   CREATININE 1.14 01/09/2018   BUN 14 01/09/2018   NA 140 01/09/2018   K 4.1 01/09/2018   CL 104 01/09/2018   CO2 29 01/09/2018      Assessment & Plan:   Problem List Items Addressed This Visit    Vitamin D deficiency    Advised restart vit D 50,000 IU weekly. Recheck levels next labs.       Type 2 diabetes mellitus with mild nonproliferative diabetic retinopathy without macular edema, bilateral (H[facility])    See above. Upcoming yearly eye exam next month.      Relevant Medications   sitaGLIPtin (JANUVIA) 100 MG tablet   metFORMIN (GLUCOPHAGE) 500 MG tablet   Other Relevant Orders   POCT glycosylated hemoglobin (Hb A1C) (Completed)   Ambulatory referral to diabetic education   OSA (obstructive sleep apnea)    Reports compliance with nightly CPAP - although it is a hassle to use encouraged continued use.      HLD (hyperlipidemia) (Chronic)    Continue lipitor daily.       Essential hypertension (Chronic)    BP elevated today - advised he start monitoring BP at home by buying new BP cuff. No antihypertensive changes today.       Diabetes mellitus type 2, uncontrolled, with complications ([place]) - Primary    Chronic. Unfortunately deteriorated control. Will increase januvia to 169m daily, start metformin 5070mbid, continue amaryl. RTC 3 mo f/u visit  Will refer to diabetes education classes.  Discussed keto diet - pt considering this as his wife wants to start - may be too restrictive with healthy carbs, instead recommend low carb diet and choosing whole grains and other healthy carb sources.       Relevant Medications   sitaGLIPtin (JANUVIA) 100 MG tablet   metFORMIN  (GLUCOPHAGE) 500 MG tablet       Meds ordered this encounter  Medications  . sitaGLIPtin (JANUVIA) 100 MG tablet    Sig: Take 1 tablet (100 mg total) by mouth daily.    Dispense:  90 tablet    Refill:  1  . metFORMIN (GLUCOPHAGE) 500 MG tablet    Sig: Take 1 tablet (500 mg total) by mouth 2 (two) times daily with a meal.    Dispense:  180 tablet    Refill:  1  . Cholecalciferol (VITAMIN D3) 50000  units TABS    Sig: Take 1 tablet by mouth once a week.    Dispense:  12 tablet    Refill:  1   Orders Placed This Encounter  Procedures  . Ambulatory referral to diabetic education    Referral Priority:   Routine    Referral Type:   Consultation    Referral Reason:   Specialty Services Required    Number of Visits Requested:   1  . POCT glycosylated hemoglobin (Hb A1C)    Follow up plan: Return in about 3 months (around 05/13/2018) for follow up visit.  Ria Bush, MD

## 2018-02-10 NOTE — Assessment & Plan Note (Signed)
Chronic. Unfortunately deteriorated control. Will increase januvia to 100mg  daily, start metformin 500mg  bid, continue amaryl. RTC 3 mo f/u visit  Will refer to diabetes education classes.  Discussed keto diet - pt considering this as his wife wants to start - may be too restrictive with healthy carbs, instead recommend low carb diet and choosing whole grains and other healthy carb sources.

## 2018-02-10 NOTE — Patient Instructions (Addendum)
Check with [facility] about new BP cuff. A1c today.  Increase januvia to 100mg  daily Start metformin 500mg  daily for 1 week then increase to twice daily. We will refer you to diabetes classes in [place] - see referral coordinator.  Restart vitamin D 16109 units weekly. Return in 3 months for follow up visit.

## 2018-02-10 NOTE — Assessment & Plan Note (Signed)
Advised restart vit D 50,000 IU weekly. Recheck levels next labs.

## 2018-02-10 NOTE — Assessment & Plan Note (Signed)
Continue lipitor daily  ?

## 2018-02-10 NOTE — Assessment & Plan Note (Addendum)
BP elevated today - advised he start monitoring BP at home by buying new BP cuff. No antihypertensive changes today.

## 2018-02-10 NOTE — Assessment & Plan Note (Addendum)
See above. Upcoming yearly eye exam next month.

## 2018-02-10 NOTE — Assessment & Plan Note (Signed)
Reports compliance with nightly CPAP - although it is a hassle to use encouraged continued use.

## 2018-02-23 ENCOUNTER — Ambulatory Visit: Payer: 59 | Admitting: Dietician

## 2018-03-05 ENCOUNTER — Ambulatory Visit: Payer: 59 | Admitting: *Deleted

## 2018-03-30 ENCOUNTER — Ambulatory Visit: Payer: 59 | Admitting: Dietician

## 2018-03-30 DIAGNOSIS — Z419 Encounter for procedure for purposes other than remedying health state, unspecified: Secondary | ICD-10-CM | POA: Diagnosis not present

## 2018-03-30 DIAGNOSIS — K029 Dental caries, unspecified: Secondary | ICD-10-CM | POA: Diagnosis not present

## 2018-04-18 LAB — HM DIABETES EYE EXAM

## 2018-04-30 DIAGNOSIS — G4733 Obstructive sleep apnea (adult) (pediatric): Secondary | ICD-10-CM | POA: Diagnosis not present

## 2018-05-06 ENCOUNTER — Emergency Department
Admission: EM | Admit: 2018-05-06 | Discharge: 2018-05-06 | Disposition: A | Discharge status: Home / Self Care | Payer: 59 | Attending: Student in an Organized Health Care Education/Training Program | Admitting: Student in an Organized Health Care Education/Training Program

## 2018-05-06 ENCOUNTER — Other Ambulatory Visit: Payer: Self-pay

## 2018-05-06 ENCOUNTER — Encounter: Payer: Self-pay | Admitting: Emergency Medicine

## 2018-05-06 DIAGNOSIS — I129 Hypertensive chronic kidney disease with stage 1 through stage 4 chronic kidney disease, or unspecified chronic kidney disease: Secondary | ICD-10-CM | POA: Insufficient documentation

## 2018-05-06 DIAGNOSIS — M546 Pain in thoracic spine: Secondary | ICD-10-CM | POA: Diagnosis not present

## 2018-05-06 DIAGNOSIS — E113293 Type 2 diabetes mellitus with mild nonproliferative diabetic retinopathy without macular edema, bilateral: Secondary | ICD-10-CM | POA: Insufficient documentation

## 2018-05-06 DIAGNOSIS — Z79899 Other long term (current) drug therapy: Secondary | ICD-10-CM | POA: Insufficient documentation

## 2018-05-06 DIAGNOSIS — E1122 Type 2 diabetes mellitus with diabetic chronic kidney disease: Secondary | ICD-10-CM | POA: Diagnosis not present

## 2018-05-06 DIAGNOSIS — N189 Chronic kidney disease, unspecified: Secondary | ICD-10-CM | POA: Insufficient documentation

## 2018-05-06 DIAGNOSIS — S29019A Strain of muscle and tendon of unspecified wall of thorax, initial encounter: Secondary | ICD-10-CM

## 2018-05-06 DIAGNOSIS — M5489 Other dorsalgia: Secondary | ICD-10-CM | POA: Diagnosis present

## 2018-05-06 DIAGNOSIS — Z7984 Long term (current) use of oral hypoglycemic drugs: Secondary | ICD-10-CM | POA: Insufficient documentation

## 2018-05-06 DIAGNOSIS — S29012A Strain of muscle and tendon of back wall of thorax, initial encounter: Secondary | ICD-10-CM | POA: Diagnosis not present

## 2018-05-06 MED ORDER — METHOCARBAMOL 500 MG PO TABS
750.0000 mg | ORAL_TABLET | Freq: Once | ORAL | Status: AC
  Administered 2018-05-06: 750 mg via ORAL
  Filled 2018-05-06: qty 2

## 2018-05-06 MED ORDER — NAPROXEN 500 MG PO TABS
500.0000 mg | ORAL_TABLET | Freq: Once | ORAL | Status: AC
  Administered 2018-05-06: 500 mg via ORAL
  Filled 2018-05-06: qty 1

## 2018-05-06 MED ORDER — NAPROXEN 500 MG PO TABS: 500.0000 mg | ORAL_TABLET | Freq: Two times a day (BID) | ORAL | 0 refills | Status: DC

## 2018-05-06 MED ORDER — METHOCARBAMOL 750 MG PO TABS: 750.0000 mg | ORAL_TABLET | Freq: Three times a day (TID) | ORAL | 0 refills | Status: DC | PRN

## 2018-05-06 NOTE — ED Triage Notes (Signed)
Pt arrives with complaints of right sided back pain that is worse with movement and pressure. Pt denies known injury. No urinary complaints/symptoms.

## 2018-05-06 NOTE — ED Provider Notes (Signed)
[facility] Emergency Department Provider Note ____________________________________________  Time seen: Approximately 9:08 PM  I have reviewed the triage vital signs and the nursing notes.  HISTORY  Chief Complaint Back Pain   HPI Jason Whitney is a 42 y.o. male who presents to the emergency department of right side back pain that is worse with movement and pressure.  Patient states that the pain started about a week ago while [place] the [place].  He denies any specific injury.  He has taken 2 doses of ibuprofen without any relief.  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  Pain has moved from the right upper back around to the right rib area.  He has never had this type of pain before.  Past Medical History:  Diagnosis Date  . Diabetes mellitus type II 2006  . HLD (hyperlipidemia)   . HTN (hypertension)   . Obesity   . 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 OSA (obstructive sleep apnea) 02/10/2018  . Diabetes mellitus type 2, uncontrolled, with complications ([place]) 00/86/7619  . Patient non adherence 01/09/2018  . Renal insufficiency 12/05/2016  . Vitamin D deficiency 12/05/2016  . Leukocytosis 12/05/2016  . Health maintenance examination 10/25/2014  . Type 2 diabetes mellitus with mild nonproliferative diabetic retinopathy without macular edema, bilateral ([place]) 04/17/2010  . HLD (hyperlipidemia) 04/17/2010  . Severe obesity (BMI >= 40) ([place]) 04/17/2010  . Essential hypertension 04/17/2010    History reviewed. No pertinent surgical history.  Prior to Admission medications   Medication Sig Start Date End Date Taking? Authorizing Provider  atorvastatin (LIPITOR) 40 MG tablet Take 1 tablet (40 mg total) by mouth at bedtime. 01/09/18   Ria Bush, MD  Blood Glucose Monitoring Suppl (ONE TOUCH ULTRA MINI) w/Device KIT Use as  directed 11/05/16   Ria Bush, MD  Cholecalciferol (VITAMIN D3) 50000 units TABS Take 1 tablet by mouth once a week. 02/10/18   Ria Bush, MD  glimepiride (AMARYL) 4 MG tablet Take 1 tablet (4 mg total) by mouth daily. 01/09/18   Ria Bush, MD  glucose blood (ONE TOUCH ULTRA TEST) test strip CHECK TWICE DAILY 01/13/18   Ria Bush, MD  losartan (COZAAR) 50 MG tablet Take 1 tablet (50 mg total) by mouth daily. 01/09/18   Ria Bush, MD  metFORMIN (GLUCOPHAGE) 500 MG tablet Take 1 tablet (500 mg total) by mouth 2 (two) times daily with a meal. 02/10/18   Ria Bush, MD  methocarbamol (ROBAXIN-750) 750 MG tablet Take 1 tablet (750 mg total) by mouth every 8 (eight) hours as needed (back or side pain). 05/06/18   Reign Bartnick, Johnette Abraham B, FNP  naproxen (NAPROSYN) 500 MG tablet Take 1 tablet (500 mg total) by mouth 2 (two) times daily with a meal. 05/06/18   Krystofer Hevener B, FNP  sitaGLIPtin (JANUVIA) 100 MG tablet Take 1 tablet (100 mg total) by mouth daily. 02/10/18   Ria Bush, MD    Allergies Patient has no known allergies.  Family History  Problem Relation Age of Onset  . Diabetes Mother   . Diabetes Maternal Grandmother   . Stroke Maternal Grandmother     Social History Social History   Tobacco Use  . Smoking status: Never Smoker  . Smokeless tobacco: Never Used  Substance Use Topics  . Alcohol use: Yes    Comment: Occasional  .  Drug use: No    Review of Systems Constitutional: Well appearing. Respiratory: Negative for dyspnea.  Negative for pleuritic pain. Cardiovascular: Negative for change in skin temperature or color. Musculoskeletal:   Positive for right lateral chest wall pain with movement  Negative for chronic steroid use   Negative for trauma in the presence of osteoporosis  Negative for age over 99 and trauma.  Negative for constitutional symptoms, or history of cancer   Negative for pain worse at night. Skin: Negative for  rash, lesion, or wound.  Genitourinary: Negative for urinary retention. Rectal: Negative for fecal incontinence or new onset constipation/bowel habit changes. Hematological/Immunilogical: Negative for immunosuppression, IV drug use, or fever Neurological: Negative for burning, tingling, numb, electric, radiating pain in the right or left lower extremity.                        Negative for saddle anesthesia.                        Negative for focal neurologic deficit, progressive or disabling symptoms             Negative for saddle anesthesia. ____________________________________________   PHYSICAL EXAM:  VITAL SIGNS: ED Triage Vitals  Enc Vitals Group     BP 05/06/18 2009 (!) 175/92     Pulse Rate 05/06/18 2009 94     Resp 05/06/18 2009 16     Temp 05/06/18 2009 98.6 F (37 C)     Temp Source 05/06/18 2009 Oral     SpO2 05/06/18 2009 96 %     Weight 05/06/18 2008 (!) 312 lb (141.5 kg)     Height 05/06/18 2008 _0  (1.803 m)     Head Circumference --      Peak Flow --      Pain Score 05/06/18 2012 0     Pain Loc --      Pain Edu? --      Excl. in [place]? --     Constitutional: Alert and oriented. Well appearing and in no acute distress. Eyes: Conjunctivae are clear without discharge or drainage.  Head: Atraumatic. Neck: Full, active range of motion. Respiratory: Respirations even and unlabored. Musculoskeletal: Full ROM of the back and extremities, Strength 5/5 of the lower extremities as tested.  Diffuse right lateral chest wall pain without focal area of tenderness. Neurologic: Motor and sensory function is intact Skin: Atraumatic.  Psychiatric: Behavior and affect are normal.  ____________________________________________   LABS (all labs ordered are listed, but only abnormal results are displayed)  Labs Reviewed - No data to display ____________________________________________  RADIOLOGY  Not  indicated ____________________________________________   PROCEDURES  Procedure(s) performed:  Procedures ____________________________________________   INITIAL IMPRESSION / ASSESSMENT AND PLAN / ED COURSE  Jason Whitney is a 42 y.o. male who presents to the emergency department for treatment and evaluation of right upper back/right lateral chest wall pain as described above.  Symptoms and exam are most consistent with muscle strain.  He will be treated with Naprosyn and Robaxin.  He is encouraged to follow-up with his primary care provider if her symptoms are not improving over the next week or so.  He is to return to the emergency department for symptoms of change or worsen if unable to schedule an appointment.  Medications  methocarbamol (ROBAXIN) tablet 750 mg (has no administration in time range)  naproxen (NAPROSYN) tablet 500 mg (500 mg Oral Given 05/06/18  2121)    ED Discharge Orders         Ordered    naproxen (NAPROSYN) 500 MG tablet  2 times daily with meals     05/06/18 2122    methocarbamol (ROBAXIN-750) 750 MG tablet  Every 8 hours PRN     05/06/18 2122           Pertinent labs & imaging results that were available during my care of the patient were reviewed by me and considered in my medical decision making (see chart for details).  _________________________________________   FINAL CLINICAL IMPRESSION(S) / ED DIAGNOSES  Final diagnoses:  Thoracic myofascial strain, initial encounter     If controlled substance prescribed during this visit, 12 month history viewed on the [place] prior to issuing an initial prescription for Schedule II or III opiod.    Victorino Dike, FNP 05/06/18 2122    Merlyn Lot, MD 05/06/18 [phone]

## 2018-05-25 ENCOUNTER — Ambulatory Visit: Payer: 59 | Admitting: Family Medicine

## 2018-06-05 ENCOUNTER — Encounter: Payer: Self-pay | Admitting: Family Medicine

## 2018-08-26 ENCOUNTER — Telehealth: Payer: Self-pay | Admitting: Radiology

## 2018-08-26 NOTE — Telephone Encounter (Signed)
Patient is out of town, he will call to schedule a lab visit and doxy.me visit when he is back.

## 2018-08-30 ENCOUNTER — Other Ambulatory Visit: Payer: Self-pay | Admitting: Family Medicine

## 2018-11-22 ENCOUNTER — Other Ambulatory Visit: Payer: Self-pay | Admitting: Family Medicine

## 2019-01-29 ENCOUNTER — Other Ambulatory Visit: Payer: Self-pay

## 2019-01-29 ENCOUNTER — Encounter: Payer: Self-pay | Admitting: Family Medicine

## 2019-01-29 ENCOUNTER — Ambulatory Visit (INDEPENDENT_AMBULATORY_CARE_PROVIDER_SITE_OTHER): Payer: 59 | Admitting: Family Medicine

## 2019-01-29 VITALS — BP 166/110 | HR 95 | Temp 97.8°F | Ht 71.0 in | Wt 318.3 lb

## 2019-01-29 DIAGNOSIS — I10 Essential (primary) hypertension: Secondary | ICD-10-CM | POA: Diagnosis not present

## 2019-01-29 DIAGNOSIS — E118 Type 2 diabetes mellitus with unspecified complications: Secondary | ICD-10-CM | POA: Diagnosis not present

## 2019-01-29 DIAGNOSIS — E1165 Type 2 diabetes mellitus with hyperglycemia: Secondary | ICD-10-CM

## 2019-01-29 DIAGNOSIS — E559 Vitamin D deficiency, unspecified: Secondary | ICD-10-CM | POA: Diagnosis not present

## 2019-01-29 DIAGNOSIS — IMO0002 Reserved for concepts with insufficient information to code with codable children: Secondary | ICD-10-CM

## 2019-01-29 DIAGNOSIS — E113293 Type 2 diabetes mellitus with mild nonproliferative diabetic retinopathy without macular edema, bilateral: Secondary | ICD-10-CM

## 2019-01-29 DIAGNOSIS — E785 Hyperlipidemia, unspecified: Secondary | ICD-10-CM

## 2019-01-29 DIAGNOSIS — G4733 Obstructive sleep apnea (adult) (pediatric): Secondary | ICD-10-CM

## 2019-01-29 LAB — POCT GLYCOSYLATED HEMOGLOBIN (HGB A1C): Hemoglobin A1C: 10 % — AB (ref 4.0–5.6)

## 2019-01-29 MED ORDER — LOSARTAN POTASSIUM 100 MG PO TABS: 100.0000 mg | ORAL_TABLET | Freq: Every day | ORAL | 1 refills | Status: DC

## 2019-01-29 MED ORDER — METFORMIN HCL ER 750 MG PO TB24: 1500.0000 mg | ORAL_TABLET | Freq: Every day | ORAL | 1 refills | Status: DC

## 2019-01-29 MED ORDER — VITAMIN D3 1.25 MG (50000 UT) PO TABS: 1.0000 | ORAL_TABLET | ORAL | 1 refills | Status: DC

## 2019-01-29 MED ORDER — GLIMEPIRIDE 4 MG PO TABS: 4.0000 mg | ORAL_TABLET | Freq: Every day | ORAL | 1 refills | Status: DC

## 2019-01-29 MED ORDER — OZEMPIC (0.25 OR 0.5 MG/DOSE) 2 MG/1.5ML ~~LOC~~ SOPN: PEN_INJECTOR | SUBCUTANEOUS | 3 refills | Status: DC

## 2019-01-29 NOTE — Assessment & Plan Note (Signed)
Chronic, uncontrolled. Increase losartan to 100mg  daily. Advised buy new BP cuff.

## 2019-01-29 NOTE — Progress Notes (Signed)
This visit was conducted in person.  BP (!) 166/110 (BP Location: Right Arm, Cuff Size: Large)   Pulse 95   Temp 97.8 F (36.6 C) (Temporal)   Ht 5' 11"  (1.803 m)   Wt (!) 318 lb 5 oz (144.4 kg)   SpO2 97%   BMI 44.40 kg/m    CC: DM f/u visit Subjective:    Patient ID: Jason Whitney, male    DOB: 05/16/1975, 43 y.o.   MRN: [id number]  HPI: Jason Whitney is a 43 y.o. male presenting on 01/29/2019 for Diabetes (Here for f/u.)   Last seen 02/2018.  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 More sedentary lifestyle, less active due to less work.   OSA prescribed CPAP - struggles with use due to more trouble sleeping.   HLD - on lipitor without myalgias.   HTN - Compliant with current antihypertensive regimen of losartan 36m daily.  Does not check blood pressures at home: machine broke.  No low blood pressure readings or symptoms of dizziness/syncope.  Denies HA, vision changes, CP/tightness, SOB, leg swelling.    DM - does not regularly check sugars - last checked 5d ago - 160 after eating. Doesn't eat breakfast. Compliant with antihyperglycemic regimen which includes: glimepiride 496mwith meal, januvia 10097maily, metformin 500m55md - forgets to take in evenings. Denies low sugars or hypoglycemic symptoms. Denies paresthesias. Last diabetic eye exam 04/2018. Pneumovax: 2014. Prevnar: not due. Glucometer brand: One-touch. DSME: has not completed.  Lab Results  Component Value Date   HGBA1C 10.0 (A) 01/29/2019   Diabetic Foot Exam - Simple   Simple Foot Form Diabetic Foot exam was performed with the following findings: Yes 01/29/2019  4:20 PM  Visual Inspection No deformities, no ulcerations, no other skin breakdown bilaterally: Yes Sensation Testing Intact to touch and monofilament testing bilaterally: Yes Pulse Check Posterior Tibialis and Dorsalis pulse intact bilaterally: Yes Comments    Lab Results  Component Value Date   MICROALBUR 2.5 (H) 11/30/2012        Relevant past medical, surgical, family and social history reviewed and updated as indicated. Interim medical history since our last visit reviewed. Allergies and medications reviewed and updated. Outpatient Medications Prior to Visit  Medication Sig Dispense Refill  . atorvastatin (LIPITOR) 40 MG tablet TAKE 1 TABLET BY MOUTH EVERYDAY AT BEDTIME 90 tablet 1  . Blood Glucose Monitoring Suppl (ONE TOUCH ULTRA MINI) w/Device KIT Use as directed 1 each 0  . glucose blood (ONE TOUCH ULTRA TEST) test strip CHECK TWICE DAILY 200 each 3  . JANUVIA 100 MG tablet TAKE 1 TABLET BY MOUTH EVERY DAY 90 tablet 1  . methocarbamol (ROBAXIN-750) 750 MG tablet Take 1 tablet (750 mg total) by mouth every 8 (eight) hours as needed (back or side pain). 30 tablet 0  . naproxen (NAPROSYN) 500 MG tablet Take 1 tablet (500 mg total) by mouth 2 (two) times daily with a meal. (Patient taking differently: Take 500 mg by mouth 2 (two) times daily with a meal. As needed) 30 tablet 0  . Cholecalciferol (VITAMIN D3) 50000 units TABS Take 1 tablet by mouth once a week. 12 tablet 1  . glimepiride (AMARYL) 4 MG tablet TAKE 1 TABLET BY MOUTH EVERY DAY 90 tablet 0  . losartan (COZAAR) 25 MG tablet TAKE 2 TABLETS BY MOUTH EVERY DAY 180 tablet 1  . metFORMIN (GLUCOPHAGE) 500 MG tablet TAKE 1 TABLET (500 MG TOTAL) BY MOUTH 2 (  TWO) TIMES DAILY WITH A MEAL. 180 tablet 0   No facility-administered medications prior to visit.      Per HPI unless specifically indicated in ROS section below Review of Systems Objective:    BP (!) 166/110 (BP Location: Right Arm, Cuff Size: Large)   Pulse 95   Temp 97.8 F (36.6 C) (Temporal)   Ht 5' 11"  (1.803 m)   Wt (!) 318 lb 5 oz (144.4 kg)   SpO2 97%   BMI 44.40 kg/m   Wt Readings from Last 3 Encounters:  01/29/19 (!) 318 lb 5 oz (144.4 kg)  05/06/18 (!) 312 lb (141.5 kg)  02/10/18 (!) 312 lb 8 oz (141.7 kg)    Physical Exam Vitals signs and  nursing note reviewed.  Constitutional:      General: He is not in acute distress.    Appearance: He is well-developed.  HENT:     Head: Normocephalic and atraumatic.     Right Ear: External ear normal.     Left Ear: External ear normal.     Nose: Nose normal.     Mouth/Throat:     Pharynx: No oropharyngeal exudate.  Eyes:     General: No scleral icterus.    Conjunctiva/sclera: Conjunctivae normal.     Pupils: Pupils are equal, round, and reactive to light.  Neck:     Musculoskeletal: Normal range of motion and neck supple.  Cardiovascular:     Rate and Rhythm: Normal rate and regular rhythm.     Heart sounds: Normal heart sounds. No murmur.  Pulmonary:     Effort: Pulmonary effort is normal. No respiratory distress.     Breath sounds: Normal breath sounds. No wheezing or rales.  Musculoskeletal:     Comments: See HPI for foot exam if done  Lymphadenopathy:     Cervical: No cervical adenopathy.  Skin:    General: Skin is warm and dry.     Findings: No rash.       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       Relevant Medications    metFORMIN (GLUCOPHAGE XR) 750 MG 24 hr tablet   Semaglutide,0.25 or 0.5MG/DOS, (OZEMPIC, 0.25 OR 0.5 MG/DOSE,) 2 MG/1.5ML SOPN   glimepiride (AMARYL) 4 MG tablet   OSA (obstructive sleep apnea)    Encouraged continued CPAP use.       HLD (hyperlipidemia) (Chronic)    Update FLP on lipitor 21m daily.  The 10-year ASCVD risk score (Mikey BussingDC Jr., et al., 2013) is: 17.2%   Values used to  calculate the score:     Age: 12 years     Sex: Male     Is Non-Hispanic African American: Yes     Diabetic: Yes     Tobacco smoker: No     Systolic Blood Pressure: 267 mmHg     Is BP treated: Yes     HDL Cholesterol: 27 mg/dL     Total Cholesterol: 163 mg/dL       Relevant Medications   losartan (COZAAR) 100 MG tablet   Other Relevant Orders   Lipid panel   Comprehensive metabolic panel   Essential hypertension (Chronic)    Chronic, uncontrolled. Increase losartan to 158m daily. Advised buy new BP cuff.       Relevant Medications   losartan (COZAAR) 100 MG tablet   Diabetes mellitus type 2, uncontrolled, with complications ([place] - Primary    Chronically uncontrolled. Poor adherence to f/u plan although endorses compliance with medications. Forgets PM metformin dose - will change to XR 7519m2 tab daily. Continue amaryl 43m343mith meal daily. Discussed advantages of weekly GLP1 RA - in place of januvia - will price out ozempic. No fmhx or personal hx thyroid cancer. Strongly emphasized need for close f/u - RTC 30mo1430mof/u visit.       Relevant Medications   metFORMIN (GLUCOPHAGE XR) 750 MG 24 hr tablet   Semaglutide,0.25 or 0.5MG/DOS, (OZEMPIC, 0.25 OR 0.5 MG/DOSE,) 2 MG/1.5ML SOPN   losartan (COZAAR) 100 MG tablet   glimepiride (AMARYL) 4 MG tablet   Other Relevant Orders   POCT glycosylated hemoglobin (Hb A1C) (Completed)       Meds ordered this encounter  Medications  . metFORMIN (GLUCOPHAGE XR) 750 MG 24 hr tablet    Sig: Take 2 tablets (1,500 mg total) by mouth daily with breakfast.     Dispense:  180 tablet    Refill:  1    In place of immediate release metformin  . Semaglutide,0.25 or 0.5MG/DOS, (OZEMPIC, 0.25 OR 0.5 MG/DOSE,) 2 MG/1.5ML SOPN    Sig: Inject 0.25 mg into the skin once a week for 14 days, THEN 0.5 mg once a week.    Dispense:  1 pen    Refill:  3    To price out in place of januvia  . losartan (COZAAR) 100 MG tablet    Sig: Take 1 tablet (100 mg total) by mouth daily.    Dispense:  90 tablet    Refill:  1  . glimepiride (AMARYL) 4 MG tablet    Sig: Take 1 tablet (4 mg total) by mouth daily.    Dispense:  90 tablet    Refill:  1  . Cholecalciferol (VITAMIN D3) 1.25 MG (50000 UT) TABS    Sig: Take 1 tablet by mouth once a week.    Dispense:  12 tablet    Refill:  1   Orders Placed This Encounter  Procedures  . Lipid panel  . Comprehensive metabolic panel  . VITAMIN D 25 Hydroxy (Vit-D Deficiency, Fractures)  . POCT glycosylated hemoglobin (Hb A1C)    Patient Instructions  Flu shot today.  Buy new blood pressure cuff. Blood pressure is staying too high -increase losartan to 100mg67mly - new dose at pharmacy.  Sugar was too high - change metformin to XR take 750mg 32mblets daily in the morning.  Price out ozempic weekly shot in place of januvi[place]ong[place] 0.25mg [id number] for 2 weeks and then increase to 0.5mg wee9m. Have pharmacist review  injection with you, or schedule nurse visit and bring medicine here for injection teaching.    Follow up plan: Return in about 3 months (around 04/30/2019) for follow up visit.  Ria Bush, MD

## 2019-01-29 NOTE — Assessment & Plan Note (Signed)
Update FLP on lipitor 40mg  daily.  The 10-year ASCVD risk score Mikey Bussing DC Brooke Bonito., et al., 2013) is: 17.2%   Values used to calculate the score:     Age: 43 years     Sex: Male     Is Non-Hispanic African American: Yes     Diabetic: Yes     Tobacco smoker: No     Systolic Blood Pressure: 102 mmHg     Is BP treated: Yes     HDL Cholesterol: 27 mg/dL     Total Cholesterol: 163 mg/dL

## 2019-01-29 NOTE — Assessment & Plan Note (Signed)
Chronically uncontrolled. Poor adherence to f/u plan although endorses compliance with medications. Forgets PM metformin dose - will change to XR 750mg  2 tab daily. Continue amaryl 4mg  with meal daily. Discussed advantages of weekly GLP1 RA - in place of januvia - will price out ozempic. No fmhx or personal hx thyroid cancer. Strongly emphasized need for close f/u - RTC 42mo DM f/u visit.

## 2019-01-29 NOTE — Patient Instructions (Addendum)
Flu shot today.  Buy new blood pressure cuff. Blood pressure is staying too high -increase losartan to 100mg  daily - new dose at pharmacy.  Sugar was too high - change metformin to XR take 750mg  2 tablets daily in the morning.  Price out ozempic weekly shot in place of [place]. Start 0.25mg  weekly for 2 weeks and then increase to 0.5mg  weekly. Have pharmacist review injection with you, or schedule nurse visit and bring medicine here for injection teaching.

## 2019-01-29 NOTE — Assessment & Plan Note (Signed)
Reviewed importance of weight loss to facilitate chronic disease management.

## 2019-01-29 NOTE — Assessment & Plan Note (Signed)
Encouraged continued CPAP use. 

## 2019-01-30 LAB — COMPREHENSIVE METABOLIC PANEL
AG Ratio: 1.1 (calc) (ref 1.0–2.5)
ALT: 17 U/L (ref 9–46)
AST: 15 U/L (ref 10–40)
Albumin: 4 g/dL (ref 3.6–5.1)
Alkaline phosphatase (APISO): 81 U/L (ref 36–130)
BUN: 18 mg/dL (ref 7–25)
CO2: 23 mmol/L (ref 20–32)
Calcium: 9.4 mg/dL (ref 8.6–10.3)
Chloride: 106 mmol/L (ref 98–110)
Creat: 1.32 mg/dL (ref 0.60–1.35)
Globulin: 3.5 g/dL (calc) (ref 1.9–3.7)
Glucose, Bld: 95 mg/dL (ref 65–99)
Potassium: 4.1 mmol/L (ref 3.5–5.3)
Sodium: 142 mmol/L (ref 135–146)
Total Bilirubin: 0.9 mg/dL (ref 0.2–1.2)
Total Protein: 7.5 g/dL (ref 6.1–8.1)

## 2019-01-30 LAB — LIPID PANEL
Cholesterol: 191 mg/dL (ref <200)
HDL: 31 mg/dL — ABNORMAL LOW (ref >=40)
LDL Cholesterol (Calc): 134 mg/dL (calc) — ABNORMAL HIGH
Non-HDL Cholesterol (Calc): 160 mg/dL (calc) — ABNORMAL HIGH (ref <130)
Total CHOL/HDL Ratio: 6.2 (calc) — ABNORMAL HIGH (ref <5.0)
Triglycerides: 140 mg/dL (ref <150)

## 2019-01-30 LAB — VITAMIN D 25 HYDROXY (VIT D DEFICIENCY, FRACTURES): Vit D, 25-Hydroxy: 15 ng/mL — ABNORMAL LOW (ref 30–100)

## 2019-04-30 ENCOUNTER — Other Ambulatory Visit: Payer: Self-pay | Admitting: Family Medicine

## 2019-04-30 ENCOUNTER — Encounter: Payer: Self-pay | Admitting: Family Medicine

## 2019-04-30 ENCOUNTER — Other Ambulatory Visit: Payer: Self-pay

## 2019-04-30 ENCOUNTER — Ambulatory Visit (INDEPENDENT_AMBULATORY_CARE_PROVIDER_SITE_OTHER): Payer: 59 | Admitting: Family Medicine

## 2019-04-30 VITALS — BP 166/80 | HR 100 | Temp 97.7°F | Ht 71.0 in | Wt 305.4 lb

## 2019-04-30 DIAGNOSIS — E118 Type 2 diabetes mellitus with unspecified complications: Secondary | ICD-10-CM

## 2019-04-30 DIAGNOSIS — I10 Essential (primary) hypertension: Secondary | ICD-10-CM | POA: Diagnosis not present

## 2019-04-30 DIAGNOSIS — IMO0002 Reserved for concepts with insufficient information to code with codable children: Secondary | ICD-10-CM

## 2019-04-30 DIAGNOSIS — E1165 Type 2 diabetes mellitus with hyperglycemia: Secondary | ICD-10-CM

## 2019-04-30 LAB — POCT GLYCOSYLATED HEMOGLOBIN (HGB A1C): Hemoglobin A1C: 7 % — AB (ref 4.0–5.6)

## 2019-04-30 MED ORDER — METFORMIN HCL ER 750 MG PO TB24: 1500.0000 mg | ORAL_TABLET | Freq: Every day | ORAL | 1 refills | Status: DC

## 2019-04-30 MED ORDER — SITAGLIPTIN PHOSPHATE 100 MG PO TABS: 100.0000 mg | ORAL_TABLET | Freq: Every day | ORAL | 1 refills | Status: DC

## 2019-04-30 MED ORDER — LOSARTAN POTASSIUM 100 MG PO TABS: 100.0000 mg | ORAL_TABLET | Freq: Every day | ORAL | 1 refills | Status: DC

## 2019-04-30 NOTE — Assessment & Plan Note (Addendum)
Congratulated on weight loss to date. Pt motivated to continue efforts. He may be interested in bariatic surgery - info provided on instructions.

## 2019-04-30 NOTE — Assessment & Plan Note (Signed)
Chronic, uncontrolled. Will monitor more closely at home and if persistently high, low threshold to add diuretic to regimen. Pt agrees with plan.

## 2019-04-30 NOTE — Assessment & Plan Note (Signed)
Chronic, marked improvement! Congratulated. Continue current regimen. He would consider injection if needed but will continue current regimen given good control to date. RTC 3-4 mo DM f/u visit.

## 2019-04-30 NOTE — Patient Instructions (Addendum)
Congratulations on weight loss and better sugar control! Look below for more info on bariatric surgery.  Start monitoring bp more closely at home, let me know if consistently >140/90. We may need to start second blood pressure medicine.  Return as needed or in 3-4 months for diabetes follow up visit.   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  You now have two options in how you learn about weight-loss surgery - in person or online.  Ensure you have all of the information that you need to evaluate the advantages and obligations of this life changing procedure.     There are several ways to register for a seminar (either on-line or in person): 1. Call [phone] 2. Go on-line to [facility] and register for either type of seminar.  [email or web address]

## 2019-04-30 NOTE — Progress Notes (Signed)
This visit was conducted in person.  BP (!) 166/80 (BP Location: Right Arm, Patient Position: Sitting, Cuff Size: Large)   Pulse 100   Temp 97.7 F (36.5 C) (Temporal)   Ht 5' 11"  (1.803 m)   Wt (!) 305 lb 7 oz (138.5 kg)   SpO2 99%   BMI 42.60 kg/m   160/100 on repeat.   CC: 3 mo f/u visit Subjective:    Patient ID: Jason Whitney, male    DOB: September 06, 1975, 43 y.o.   MRN: [id number]  HPI: Jason Whitney is a 43 y.o. male presenting on 04/30/2019 for Follow-up (Here for 3 mo f/u.)   HTN - Compliant with current antihypertensive regimen of lozaar 169m daily.  Does not check blood pressures at home: machine broke.  No low blood pressure readings or symptoms of dizziness/syncope.  Denies HA, vision changes, CP/tightness, SOB, leg swelling.    DM - does not regularly check sugars. Last check 2 d ago fasting 100. Compliant with antihyperglycemic regimen which includes: amaryl 453mdaily, metformin XR 150052maily, januvia 100m28mily. Remembering to take metformin better now its once daily dosing. Usually skips breakfast, takes lunch. Denies low sugars or hypoglycemic symptoms. Denies paresthesias. Last diabetic eye exam DUE. Pneumovax: 2014. Prevnar: not due. Glucometer brand: one-touch. DSME: declines.  Lab Results  Component Value Date   HGBA1C 7.0 (A) 04/30/2019   Diabetic Foot Exam - Simple   No data filed     Lab Results  Component Value Date   MICROALBUR 2.5 (H) 11/30/2012     Known OSA - non restorative sleep pattern. Trouble tolerating CPAP machine.  13 lb weight loss noted - has been trying to go to gym more often.      Relevant past medical, surgical, family and social history reviewed and updated as indicated. Interim medical history since our last visit reviewed. Allergies and medications reviewed and updated. Outpatient Medications Prior to Visit  Medication Sig Dispense Refill  . atorvastatin (LIPITOR) 40 MG tablet TAKE 1 TABLET BY MOUTH EVERYDAY AT BEDTIME 90  tablet 1  . Blood Glucose Monitoring Suppl (ONE TOUCH ULTRA MINI) w/Device KIT Use as directed 1 each 0  . Cholecalciferol (VITAMIN D3) 1.25 MG (50000 UT) TABS Take 1 tablet by mouth once a week. 12 tablet 1  . glimepiride (AMARYL) 4 MG tablet Take 1 tablet (4 mg total) by mouth daily. 90 tablet 1  . glucose blood (ONE TOUCH ULTRA TEST) test strip CHECK TWICE DAILY 200 each 3  . JANUVIA 100 MG tablet TAKE 1 TABLET BY MOUTH EVERY DAY 90 tablet 1  . losartan (COZAAR) 100 MG tablet Take 1 tablet (100 mg total) by mouth daily. 90 tablet 1  . metFORMIN (GLUCOPHAGE XR) 750 MG 24 hr tablet Take 2 tablets (1,500 mg total) by mouth daily with breakfast. 180 tablet 1  . methocarbamol (ROBAXIN-750) 750 MG tablet Take 1 tablet (750 mg total) by mouth every 8 (eight) hours as needed (back or side pain). 30 tablet 0  . naproxen (NAPROSYN) 500 MG tablet Take 1 tablet (500 mg total) by mouth 2 (two) times daily with a meal. (Patient taking differently: Take 500 mg by mouth 2 (two) times daily with a meal. As needed) 30 tablet 0   No facility-administered medications prior to visit.     Per HPI unless specifically indicated in ROS section below Review of Systems Objective:    BP (!) 166/80 (BP Location: Right Arm, Patient Position: Sitting, Cuff Size: Large)  Pulse 100   Temp 97.7 F (36.5 C) (Temporal)   Ht 5' 11"  (1.803 m)   Wt (!) 305 lb 7 oz (138.5 kg)   SpO2 99%   BMI 42.60 kg/m   Wt Readings from Last 3 Encounters:  04/30/19 (!) 305 lb 7 oz (138.5 kg)  01/29/19 (!) 318 lb 5 oz (144.4 kg)  05/06/18 (!) 312 lb (141.5 kg)    Physical Exam Vitals and nursing note reviewed.  Constitutional:      General: He is not in acute distress.    Appearance: Normal appearance. He is well-developed. He is obese. He is not ill-appearing.  HENT:     Head: Normocephalic and atraumatic.  Eyes:     General: No scleral icterus.    Extraocular Movements: Extraocular movements intact.     Conjunctiva/sclera:  Conjunctivae normal.     Pupils: Pupils are equal, round, and reactive to light.  Cardiovascular:     Rate and Rhythm: Normal rate and regular rhythm.     Pulses: Normal pulses.     Heart sounds: Normal heart sounds. No murmur.  Pulmonary:     Effort: Pulmonary effort is normal. No respiratory distress.     Breath sounds: Normal breath sounds. No wheezing, rhonchi or rales.  Musculoskeletal:     Cervical back: Normal range of motion and neck supple.     Comments: See HPI for foot exam if done  Lymphadenopathy:     Cervical: No cervical adenopathy.  Skin:    General: Skin is warm and dry.     Findings: No rash.  Neurological:     Mental Status: He is alert.  Psychiatric:        Mood and Affect: Mood normal.        Behavior: Behavior normal.       Results for orders placed or performed in visit on 04/30/19  POCT glycosylated hemoglobin (Hb A1C)  Result Value Ref Range   Hemoglobin A1C 7.0 (A) 4.0 - 5.6 %   HbA1c POC (<> result, manual entry)     HbA1c, POC (prediabetic range)     HbA1c, POC (controlled diabetic range)     Assessment & Plan:  This visit occurred during the SARS-CoV-2 public health emergency.  Safety protocols were in place, including screening questions prior to the visit, additional usage of staff PPE, and extensive cleaning of exam room while observing appropriate contact time as indicated for disinfecting solutions.   Problem List Items Addressed This Visit    Severe obesity (BMI >= 40) (HCC)    Congratulated on weight loss to date. Pt motivated to continue efforts. He may be interested in bariatic surgery - info provided on instructions.       Relevant Medications   sitaGLIPtin (JANUVIA) 100 MG tablet   metFORMIN (GLUCOPHAGE XR) 750 MG 24 hr tablet   Essential hypertension (Chronic)    Chronic, uncontrolled. Will monitor more closely at home and if persistently high, low threshold to add diuretic to regimen. Pt agrees with plan.       Relevant  Medications   losartan (COZAAR) 100 MG tablet   Diabetes mellitus type 2, uncontrolled, with complications (HCC) - Primary    Chronic, marked improvement! Congratulated. Continue current regimen. He would consider injection if needed but will continue current regimen given good control to date. RTC 3-4 mo DM f/u visit.       Relevant Medications   sitaGLIPtin (JANUVIA) 100 MG tablet   losartan (COZAAR) 100  MG tablet   metFORMIN (GLUCOPHAGE XR) 750 MG 24 hr tablet   Other Relevant Orders   POCT glycosylated hemoglobin (Hb A1C) (Completed)       Meds ordered this encounter  Medications  . sitaGLIPtin (JANUVIA) 100 MG tablet    Sig: Take 1 tablet (100 mg total) by mouth daily.    Dispense:  90 tablet    Refill:  1  . losartan (COZAAR) 100 MG tablet    Sig: Take 1 tablet (100 mg total) by mouth daily.    Dispense:  90 tablet    Refill:  1  . metFORMIN (GLUCOPHAGE XR) 750 MG 24 hr tablet    Sig: Take 2 tablets (1,500 mg total) by mouth daily with breakfast.    Dispense:  180 tablet    Refill:  1   Orders Placed This Encounter  Procedures  . POCT glycosylated hemoglobin (Hb A1C)    Patient Instructions  Congratulations on weight loss and better sugar control! Look below for more info on bariatric surgery.  Start monitoring bp more closely at home, let me know if consistently >140/90. We may need to start second blood pressure medicine.  Return as needed or in 3-4 months for diabetes follow up visit.   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  You now have two options in how you learn about weight-loss surgery - in person or online.  Ensure you have all of the information that you need to evaluate the advantages and obligations of this life changing procedure.     There are several ways to register for a seminar (either on-line or in person): 1. Call [phone] 2. Go on-line to [facility] and register for either type of seminar.  [email or web address]     Follow up plan: Return in about 3 months (around 07/29/2019) for follow up visit.  Ria Bush, MD

## 2019-05-29 ENCOUNTER — Other Ambulatory Visit: Payer: Self-pay | Admitting: Family Medicine

## 2019-05-31 MED ORDER — OZEMPIC (0.25 OR 0.5 MG/DOSE) 2 MG/1.5ML ~~LOC~~ SOPN: 0.5000 mg | PEN_INJECTOR | SUBCUTANEOUS | 1 refills | Status: DC

## 2019-05-31 NOTE — Addendum Note (Signed)
Addended by: Eustaquio Boyden on: 05/31/2019 06:17 PM   Modules accepted: Orders

## 2019-08-08 ENCOUNTER — Other Ambulatory Visit: Payer: Self-pay | Admitting: Family Medicine

## 2019-09-03 ENCOUNTER — Ambulatory Visit: Payer: 59 | Admitting: Family Medicine

## 2019-10-15 ENCOUNTER — Other Ambulatory Visit: Payer: Self-pay | Admitting: Family Medicine

## 2019-10-15 NOTE — Telephone Encounter (Signed)
Did you want pt to take the 0.5 mg wkly for only 14 days?

## 2020-01-08 ENCOUNTER — Other Ambulatory Visit: Payer: Self-pay | Admitting: Family Medicine

## 2020-01-10 NOTE — Telephone Encounter (Signed)
E-scribed refill.  Plz schedule cpe and lab visits.  

## 2020-01-31 ENCOUNTER — Encounter: Payer: Self-pay | Admitting: Family Medicine

## 2020-01-31 ENCOUNTER — Telehealth (INDEPENDENT_AMBULATORY_CARE_PROVIDER_SITE_OTHER): Payer: 59 | Admitting: Family Medicine

## 2020-01-31 ENCOUNTER — Telehealth: Payer: Self-pay

## 2020-01-31 VITALS — Temp 97.8°F | Ht 71.0 in | Wt 299.4 lb

## 2020-01-31 DIAGNOSIS — E113293 Type 2 diabetes mellitus with mild nonproliferative diabetic retinopathy without macular edema, bilateral: Secondary | ICD-10-CM | POA: Diagnosis not present

## 2020-01-31 DIAGNOSIS — I10 Essential (primary) hypertension: Secondary | ICD-10-CM | POA: Diagnosis not present

## 2020-01-31 DIAGNOSIS — U071 COVID-19: Secondary | ICD-10-CM

## 2020-01-31 HISTORY — DX: COVID-19: U07.1

## 2020-01-31 NOTE — Assessment & Plan Note (Signed)
First day of symptoms 01/29/2020.  Positive COVID result 01/31/2020 - asked to send through mychart.  Mild symptoms up to now. Reviewed self isolation at home for 10 days (rest of family not sick). Reviewed home care for 10 days. rec start vit C, vit D, and zinc to boost immune system.  He is at risk for complications due to DM, HTN, BMI >40. Discussed mAb infusion treatment. He is not convinced but would like phone call to consider treatment. Encouraged he get this. Will forward to Mayra Reel to discuss with patient.  Will place on COVID call list to check on pt every 2 days until 10 days from first day of symptoms.

## 2020-01-31 NOTE — Telephone Encounter (Signed)
[facility] Night - Client Nonclinical Telephone Record AccessNurse Client [facility] Night - Client Client Site [facility] - Night Physician Eustaquio Boyden - MD Contact Type Call Who Is Calling Patient / Member / Family / Caregiver Caller Name Harold Moncus Caller Phone Number [phone] Patient Name Jason Whitney Patient DOB [birth year] Call Type Message Only Information Provided Reason for Call Request to Reschedule Office Appointment Initial Comment Caller states that he has an appointment at 3 p.m. and he just found out that he tested positive for COVID. He believes he needs to reschedule but would like to speak to someone at the office to be sure. Additional Comment Caller declined triage at this time but stated he would like a message to be sent to the office to discuss whether or not he should reschedule his appointment since he is COVID positive. Disp. Time Disposition Final User 01/31/2020 7:32:51 AM General Information Provided Yes Cherylynn Ridges Call Closed By: Cherylynn Ridges Transaction Date/Time: 01/31/2020 7:30:23 AM (ET)

## 2020-01-31 NOTE — Telephone Encounter (Signed)
Noted.  Pt added to Call Log.  

## 2020-01-31 NOTE — Progress Notes (Signed)
Virtual visit completed through MyChart, a video enabled telemedicine application. Due to national recommendations of social distancing due to COVID-19, a virtual visit is felt to be most appropriate for this patient at this time. Reviewed limitations, risks, security and privacy concerns of performing a virtual visit and the availability of in person appointments. I also reviewed that there may be a patient responsible charge related to this service. The patient agreed to proceed.   Patient location: home  Provider location:  at [facility], office  Persons participating in this virtual visit: patient, provider   If any vitals were documented, they were collected by patient at home unless specified below.    Temp 97.8 F (36.6 C)   Ht 5' 11"  (1.803 m)   Wt 299 lb 6 oz (135.8 kg)   BMI 41.75 kg/m    CC: HA  Subjective:    Patient ID: Jason Whitney, male    DOB: 10-26-1975, 44 y.o.   MRN: [id number]  HPI: Jason Whitney is a 44 y.o. male presenting on 01/31/2020 for Headache (C/o HA, body aches and cough.  Denies any other sxs.  Sxs started 01/29/20.  Had postive result on home test.  Then had official test done on 01/29/20. Recieved positive COVID results today.  )   2d h/o intermittent HA, body aches, intermittent cough.   No fevers/chills, shortness of breath, loss of taste /smell, abd pain, diarrhea or nausea, ST, ear pain, rash. No significant congestion.   He did at home- covid test kit which returned positive. Later that day went to local testing site - results returned positive this morning.   No known positive COVID exposure.  Children tested negative.  Wife tested negative.   Didn't get COVID vaccine.       Relevant past medical, surgical, family and social history reviewed and updated as indicated. Interim medical history since our last visit reviewed. Allergies and medications reviewed and updated. Outpatient Medications Prior to Visit  Medication Sig Dispense  Refill  . atorvastatin (LIPITOR) 40 MG tablet TAKE 1 TABLET BY MOUTH EVERYDAY AT BEDTIME 90 tablet 0  . Blood Glucose Monitoring Suppl (ONE TOUCH ULTRA MINI) w/Device KIT Use as directed 1 each 0  . glimepiride (AMARYL) 4 MG tablet Take 1 tablet (4 mg total) by mouth daily. 90 tablet 1  . glucose blood (ONE TOUCH ULTRA TEST) test strip CHECK TWICE DAILY 200 each 3  . losartan (COZAAR) 100 MG tablet Take 1 tablet (100 mg total) by mouth daily. 90 tablet 1  . metFORMIN (GLUCOPHAGE XR) 750 MG 24 hr tablet Take 2 tablets (1,500 mg total) by mouth daily with breakfast. 180 tablet 1  . sitaGLIPtin (JANUVIA) 100 MG tablet Take 1 tablet (100 mg total) by mouth daily. 90 tablet 1  . Cholecalciferol (VITAMIN D3) 1.25 MG (50000 UT) TABS Take 1 tablet by mouth once a week. 12 tablet 1   No facility-administered medications prior to visit.     Per HPI unless specifically indicated in ROS section below Review of Systems Objective:  Temp 97.8 F (36.6 C)   Ht 5' 11"  (1.803 m)   Wt 299 lb 6 oz (135.8 kg)   BMI 41.75 kg/m   Wt Readings from Last 3 Encounters:  01/31/20 299 lb 6 oz (135.8 kg)  04/30/19 (!) 305 lb 7 oz (138.5 kg)  01/29/19 (!) 318 lb 5 oz (144.4 kg)       Physical exam: Gen: alert, NAD, not ill appearing Pulm: speaks  in complete sentences without increased work of breathing Psych: normal mood, normal thought content      Results for orders placed or performed in visit on 04/30/19  POCT glycosylated hemoglobin (Hb A1C)  Result Value Ref Range   Hemoglobin A1C 7.0 (A) 4.0 - 5.6 %   HbA1c POC (<> result, manual entry)     HbA1c, POC (prediabetic range)     HbA1c, POC (controlled diabetic range)     Assessment & Plan:   Problem List Items Addressed This Visit    Type 2 diabetes mellitus with mild nonproliferative diabetic retinopathy without macular edema, bilateral (HCC)   Severe obesity (BMI >= 40) (HCC)   Essential hypertension - Primary (Chronic)   COVID-19 virus  infection    First day of symptoms 01/29/2020.  Positive COVID result 01/31/2020 - asked to send through mychart.  Mild symptoms up to now. Reviewed self isolation at home for 10 days (rest of family not sick). Reviewed home care for 10 days. rec start vit C, vit D, and zinc to boost immune system.  He is at risk for complications due to DM, HTN, BMI >40. Discussed mAb infusion treatment. He is not convinced but would like phone call to consider treatment. Encouraged he get this. Will forward to Allie Bossier to discuss with patient.  Will place on COVID call list to check on pt every 2 days until 10 days from first day of symptoms.           No orders of the defined types were placed in this encounter.  No orders of the defined types were placed in this encounter.   I discussed the assessment and treatment plan with the patient. The patient was provided an opportunity to ask questions and all were answered. The patient agreed with the plan and demonstrated an understanding of the instructions. The patient was advised to call back or seek an in-person evaluation if the symptoms worsen or if the condition fails to improve as anticipated.  Follow up plan: No follow-ups on file.  Ria Bush, MD

## 2020-01-31 NOTE — Telephone Encounter (Signed)
Pt said tested + covid on 01/31/20; pt is having a headache; pain level now is 6. Pt has generalized weakness in legs and body aches; non prod cough;  No other covid symptoms; no travel and no known exposure to + covid. No covid vaccines. Pt already has appt today at 3pm with Dr Reece Agar; changed that appt to my chart video appt today at 3P<. Pt is to rest, drink plenty of fluids, tylenol if fever and self quarantining 14 days from today. UC & ED precautions given and pt voiced understanding.

## 2020-01-31 NOTE — Telephone Encounter (Signed)
Seen today virtually. plz place on covid call list to check on him every 2 days until 02/09/2020 and improved.

## 2020-02-01 ENCOUNTER — Other Ambulatory Visit: Payer: Self-pay | Admitting: Primary Care

## 2020-02-01 ENCOUNTER — Telehealth: Payer: Self-pay | Admitting: Primary Care

## 2020-02-01 DIAGNOSIS — U071 COVID-19: Secondary | ICD-10-CM

## 2020-02-01 NOTE — Progress Notes (Signed)
I connected by phone with Jason Whitney on 02/01/2020 at 4:40 PM to discuss the potential use of a new treatment for mild to moderate COVID-19 viral infection in non-hospitalized patients.  This patient is a 44 y.o. male that meets the FDA criteria for Emergency Use Authorization of COVID monoclonal antibody casirivimab/imdevimab.  Has a (+) direct SARS-CoV-2 viral test result  Has mild or moderate COVID-19   Is NOT hospitalized due to COVID-19  Is within 10 days of symptom onset  Has at least one of the high risk factor(s) for progression to severe COVID-19 and/or hospitalization as defined in EUA.  Specific high risk criteria : BMI > 25, Diabetes and Cardiovascular disease or hypertension   I have spoken and communicated the following to the patient or parent/caregiver regarding COVID monoclonal antibody treatment:  1. FDA has authorized the emergency use for the treatment of mild to moderate COVID-19 in adults and pediatric patients with positive results of direct SARS-CoV-2 viral testing who are 75 years of age and older weighing at least 40 kg, and who are at high risk for progressing to severe COVID-19 and/or hospitalization.  2. The significant known and potential risks and benefits of COVID monoclonal antibody, and the extent to which such potential risks and benefits are unknown.  3. Information on available alternative treatments and the risks and benefits of those alternatives, including clinical trials.  4. Patients treated with COVID monoclonal antibody should continue to self-isolate and use infection control measures (e.g., wear mask, isolate, social distance, avoid sharing personal items, clean and disinfect "high touch" surfaces, and frequent handwashing) according to CDC guidelines.   5. The patient or parent/caregiver has the option to accept or refuse COVID monoclonal antibody treatment.  After reviewing this information with the patient, The patient agreed to proceed  with receiving casirivimab\imdevimab infusion and will be provided a copy of the Fact sheet prior to receiving the infusion. Doreene Nest 02/01/2020 4:40 PM

## 2020-02-01 NOTE — Telephone Encounter (Signed)
Noted. Thanks.

## 2020-02-01 NOTE — Telephone Encounter (Signed)
Called patient to discuss symptoms, symptoms seem to be progressing.  He qualifies for monoclonal antibody treatment and is scheduled for 02/02/2020 at 8:30 AM at the [facility] infusion center. Will place orders.  We will send my chart message with details.  He will bring his positive COVID-19 result.

## 2020-02-01 NOTE — Telephone Encounter (Signed)
-----   Message from Eustaquio Boyden, MD sent at 01/31/2020  3:39 PM EDT ----- Regarding: mAb infusion treatment patient First day of symptoms 01/29/2020.  Positive COVID result 01/31/2020 - asked to send through mychart.   He is at risk for complications due to DM, HTN, BMI >40. Offered mAb infusion treatment. He is not convinced but would like phone call to discuss option, consider treatment. Encouraged he get this.   Will forward to Mayra Reel to discuss with patient.   Thanks!

## 2020-02-02 ENCOUNTER — Other Ambulatory Visit (HOSPITAL_COMMUNITY): Payer: Self-pay

## 2020-02-02 ENCOUNTER — Ambulatory Visit (HOSPITAL_COMMUNITY)
Admission: RE | Admit: 2020-02-02 | Discharge: 2020-02-02 | Disposition: A | Discharge status: Home / Self Care | Payer: 59 | Source: Ambulatory Visit | Attending: Pulmonary Disease | Admitting: Pulmonary Disease

## 2020-02-02 DIAGNOSIS — U071 COVID-19: Secondary | ICD-10-CM | POA: Diagnosis not present

## 2020-02-02 MED ORDER — SODIUM CHLORIDE 0.9 % IV SOLN
1200.0000 mg | Freq: Once | INTRAVENOUS | Status: AC
  Administered 2020-02-02: 1200 mg via INTRAVENOUS

## 2020-02-02 MED ORDER — METHYLPREDNISOLONE SODIUM SUCC 125 MG IJ SOLR: 125.0000 mg | Freq: Once | INTRAMUSCULAR | Status: DC | PRN

## 2020-02-02 MED ORDER — DIPHENHYDRAMINE HCL 50 MG/ML IJ SOLN: 50.0000 mg | Freq: Once | INTRAMUSCULAR | Status: DC | PRN

## 2020-02-02 MED ORDER — SODIUM CHLORIDE 0.9 % IV SOLN: INTRAVENOUS | Status: DC | PRN

## 2020-02-02 MED ORDER — EPINEPHRINE 0.3 MG/0.3ML IJ SOAJ: 0.3000 mg | Freq: Once | INTRAMUSCULAR | Status: DC | PRN

## 2020-02-02 MED ORDER — ALBUTEROL SULFATE HFA 108 (90 BASE) MCG/ACT IN AERS: 2.0000 | INHALATION_SPRAY | Freq: Once | RESPIRATORY_TRACT | Status: DC | PRN

## 2020-02-02 MED ORDER — FAMOTIDINE IN NACL 20-0.9 MG/50ML-% IV SOLN: 20.0000 mg | Freq: Once | INTRAVENOUS | Status: DC | PRN

## 2020-02-02 MED ORDER — ACETAMINOPHEN 325 MG PO TABS
650.0000 mg | ORAL_TABLET | Freq: Once | ORAL | Status: AC
  Administered 2020-02-02: 650 mg via ORAL
  Filled 2020-02-02: qty 2

## 2020-02-02 NOTE — Progress Notes (Signed)
  Diagnosis: COVID-19  Physician: Dr. Wright  Procedure: [facility] Med: casirivimab\imdevimab infusion - Provided patient with casirivimab\imdevimab fact sheet for patients, parents and caregivers prior to infusion.  Complications: No immediate complications noted.  Discharge: Discharged home   Dorien Bessent M Rakesh Dutko 02/02/2020  

## 2020-02-02 NOTE — Discharge Instructions (Signed)

## 2020-02-17 ENCOUNTER — Other Ambulatory Visit: Payer: Self-pay | Admitting: Family Medicine

## 2020-02-18 NOTE — Telephone Encounter (Signed)
Patient called in stating he does take the ozempic and takes it once weekly. Please advise.

## 2020-02-18 NOTE — Telephone Encounter (Signed)
Pharmacy requesting 90-day supply.    E-scribed refill.

## 2020-02-18 NOTE — Telephone Encounter (Signed)
Med not on current list.  Lvm asking pt to call back.  Need to know if pt it taking Ozempic.

## 2020-03-18 LAB — HM DIABETES EYE EXAM

## 2020-03-20 ENCOUNTER — Encounter: Payer: Self-pay | Admitting: Family Medicine

## 2020-03-21 ENCOUNTER — Encounter: Payer: Self-pay | Admitting: Family Medicine

## 2020-04-02 ENCOUNTER — Other Ambulatory Visit: Payer: Self-pay | Admitting: Family Medicine

## 2020-04-03 ENCOUNTER — Other Ambulatory Visit: Payer: Self-pay | Admitting: Family Medicine

## 2020-04-03 NOTE — Telephone Encounter (Signed)
E-scribed refill.  Plz schedule cpe and lab visits.  

## 2020-04-05 ENCOUNTER — Encounter: Payer: Self-pay | Admitting: Family Medicine

## 2020-04-05 ENCOUNTER — Telehealth: Payer: Self-pay | Admitting: Family Medicine

## 2020-04-05 NOTE — Telephone Encounter (Signed)
Needs to schedule cpe and labs. Called and spoke with patient told me that he would call me back so he could look at his work schedule. EM

## 2020-07-09 ENCOUNTER — Other Ambulatory Visit: Payer: Self-pay | Admitting: Family Medicine

## 2020-07-12 ENCOUNTER — Telehealth: Payer: Self-pay | Admitting: Family Medicine

## 2020-07-12 NOTE — Telephone Encounter (Signed)
Pharmacy requests refill on: Metformin 750 mg 24 hr   LAST REFILL: 04/30/2019 (Q-180, R-1) LAST OV: 04/30/2019 NEXT OV: Not Scheduled  PHARMACY: [id number] [place], [place]  Hgb A1C (04/30/2019): 7.0

## 2020-07-12 NOTE — Telephone Encounter (Signed)
Noted  

## 2020-07-12 NOTE — Telephone Encounter (Signed)
Called and left detailed message per DPR to schedule CPE and labs. EM

## 2020-07-13 NOTE — Telephone Encounter (Signed)
Called patient back and was able to reach him. He didn't want to schedule with me right now said he had to look at his work calendar. States that he would call back. Provided our hours to him so he could call back. EM

## 2020-07-13 NOTE — Telephone Encounter (Signed)
Noted  

## 2020-09-27 ENCOUNTER — Other Ambulatory Visit: Payer: Self-pay | Admitting: Family Medicine

## 2020-09-27 NOTE — Telephone Encounter (Signed)
Pharmacy requests refill on: Glimepiride 4 mg   LAST REFILL: 01/29/2019 (Q-90, R-1)  LAST OV: 04/30/2019 NEXT OV: Not Scheduled  PHARMACY: [id number] [place], [place]  Hgb A1C (01/29/2019)

## 2020-09-29 NOTE — Telephone Encounter (Signed)
Left Detailed message to call our office to schedule CPE and labs. EM

## 2020-10-01 ENCOUNTER — Other Ambulatory Visit: Payer: Self-pay | Admitting: Family Medicine

## 2020-10-14 ENCOUNTER — Other Ambulatory Visit: Payer: Self-pay | Admitting: Family Medicine

## 2020-10-16 ENCOUNTER — Other Ambulatory Visit: Payer: Self-pay | Admitting: Family Medicine

## 2020-10-23 ENCOUNTER — Other Ambulatory Visit: Payer: Self-pay | Admitting: Family Medicine

## 2020-10-26 ENCOUNTER — Other Ambulatory Visit: Payer: Self-pay | Admitting: Family Medicine

## 2020-10-30 ENCOUNTER — Other Ambulatory Visit: Payer: Self-pay | Admitting: Family Medicine

## 2020-10-31 ENCOUNTER — Other Ambulatory Visit: Payer: Self-pay | Admitting: Family Medicine

## 2020-11-25 ENCOUNTER — Other Ambulatory Visit: Payer: Self-pay | Admitting: Family Medicine

## 2020-11-27 NOTE — Telephone Encounter (Signed)
Last OV - 04/30/2019 Next OV - N/A  Patient was sent a letter indicating that he was overdue for a Annual physical. Patient request for a med refill please advise.

## 2020-12-07 ENCOUNTER — Other Ambulatory Visit: Payer: Self-pay | Admitting: Family Medicine

## 2021-01-08 ENCOUNTER — Encounter: Payer: Self-pay | Admitting: Family Medicine

## 2021-01-08 ENCOUNTER — Ambulatory Visit: Payer: 59 | Admitting: Family Medicine

## 2021-01-08 ENCOUNTER — Other Ambulatory Visit: Payer: Self-pay

## 2021-01-08 VITALS — BP 190/110 | HR 114 | Temp 98.1°F | Ht 71.0 in | Wt 310.0 lb

## 2021-01-08 DIAGNOSIS — E118 Type 2 diabetes mellitus with unspecified complications: Secondary | ICD-10-CM | POA: Diagnosis not present

## 2021-01-08 DIAGNOSIS — E1169 Type 2 diabetes mellitus with other specified complication: Secondary | ICD-10-CM | POA: Diagnosis not present

## 2021-01-08 DIAGNOSIS — E113391 Type 2 diabetes mellitus with moderate nonproliferative diabetic retinopathy without macular edema, right eye: Secondary | ICD-10-CM

## 2021-01-08 DIAGNOSIS — I10 Essential (primary) hypertension: Secondary | ICD-10-CM | POA: Diagnosis not present

## 2021-01-08 DIAGNOSIS — N289 Disorder of kidney and ureter, unspecified: Secondary | ICD-10-CM

## 2021-01-08 DIAGNOSIS — E1165 Type 2 diabetes mellitus with hyperglycemia: Secondary | ICD-10-CM

## 2021-01-08 DIAGNOSIS — E785 Hyperlipidemia, unspecified: Secondary | ICD-10-CM | POA: Diagnosis not present

## 2021-01-08 DIAGNOSIS — E559 Vitamin D deficiency, unspecified: Secondary | ICD-10-CM

## 2021-01-08 DIAGNOSIS — IMO0002 Reserved for concepts with insufficient information to code with codable children: Secondary | ICD-10-CM

## 2021-01-08 LAB — POCT GLYCOSYLATED HEMOGLOBIN (HGB A1C): Hemoglobin A1C: 8.3 % — AB (ref 4.0–5.6)

## 2021-01-08 MED ORDER — ATORVASTATIN CALCIUM 40 MG PO TABS
40.0000 mg | ORAL_TABLET | Freq: Every day | ORAL | 3 refills | Status: DC
Start: 2021-01-08 — End: 2022-02-15

## 2021-01-08 MED ORDER — METFORMIN HCL ER 750 MG PO TB24
1500.0000 mg | ORAL_TABLET | Freq: Every day | ORAL | 3 refills | Status: DC
Start: 2021-01-08 — End: 2022-02-15

## 2021-01-08 MED ORDER — LOSARTAN POTASSIUM-HCTZ 100-12.5 MG PO TABS: 1.0000 | ORAL_TABLET | Freq: Every day | ORAL | 3 refills | Status: DC

## 2021-01-08 MED ORDER — GLIMEPIRIDE 4 MG PO TABS
4.0000 mg | ORAL_TABLET | Freq: Every day | ORAL | 3 refills | Status: DC
Start: 2021-01-08 — End: 2022-02-15

## 2021-01-08 MED ORDER — SILDENAFIL CITRATE 100 MG PO TABS
50.0000 mg | ORAL_TABLET | Freq: Every day | ORAL | 11 refills | Status: DC | PRN
Start: 2021-01-08 — End: 2021-11-08

## 2021-01-08 MED ORDER — OZEMPIC (0.25 OR 0.5 MG/DOSE) 2 MG/1.5ML ~~LOC~~ SOPN: PEN_INJECTOR | SUBCUTANEOUS | 4 refills | Status: AC

## 2021-01-08 NOTE — Assessment & Plan Note (Signed)
Update renal panel 

## 2021-01-08 NOTE — Assessment & Plan Note (Addendum)
Update vit D levels, not on replacement.

## 2021-01-08 NOTE — Assessment & Plan Note (Addendum)
Overdue for eye exam - advised call to schedule.

## 2021-01-08 NOTE — Assessment & Plan Note (Addendum)
Chronic, deteriorated, with hypertensive urgency today.  Has been off losartan for the past few months.  Will restart combo losartan/hctz 100/12.5mg  daily, advised start 1/2 tab daily for 1 wk then increase to full dose.  BP log card provided for him to track at home.  RTC 2-3 mo HTN f/u visit.  Update EKG today.

## 2021-01-08 NOTE — Assessment & Plan Note (Signed)
Weight gain noted. Restart ozempic.

## 2021-01-08 NOTE — Progress Notes (Signed)
Patient ID: Jason Whitney, male    DOB: 01-16-1976, 45 y.o.   MRN: [id number]  This visit was conducted in person.  BP (!) 190/110 (BP Location: Right Arm, Cuff Size: Large)   Pulse (!) 114   Temp 98.1 F (36.7 C) (Temporal)   Ht 5' 11"  (1.803 m)   Wt (!) 310 lb (140.6 kg)   SpO2 98%   BMI 43.24 kg/m    CC: med refill visit  Subjective:   HPI: Jason Whitney is a 45 y.o. male presenting on 01/08/2021 for Medication Refill   Last seen 04/2019.  COVID infection 01/2020 treated with IV monoclonal antibody infusion.  Got COVID again 07/2020 - less severe second time around. All family was sick at that time.  Never got COVID vaccine.  Declines flu shot.   Ran out of all meds several months ago. Still has a few amaryl and metformin.   To start new more physical route at work.   HTN - off losartan 163m for the past month. Does check blood pressures at home: better controlled when on BP meds - checks about once a week. No low blood pressure readings or symptoms of dizziness/syncope. Denies HA, vision changes, CP/tightness, SOB, leg swelling. Occasional headache.   DM - does note regularly check sugars - about once weekly, fluctuations noted - 90-160. Lowest 70, highest 150. Compliant with antihyperglycemic regimen which includes: metformin XR 15047mdaily and amaryl 15m38maily. Denies low sugars or hypoglycemic symptoms. Denies paresthesias, blurry vision. Last diabetic eye exam 03/2020 - mod non-proliferative retinopathy to R eye without edema - due for f/u. Glucometer brand: one-touch. Last foot exam: DUE. DSME: did not complete.  Lab Results  Component Value Date   HGBA1C 8.3 (A) 01/08/2021   Diabetic Foot Exam - Simple   Simple Foot Form Diabetic Foot exam was performed with the following findings: Yes 01/08/2021  3:10 PM  Visual Inspection No deformities, no ulcerations, no other skin breakdown bilaterally: Yes Sensation Testing Intact to touch and monofilament testing  bilaterally: Yes Pulse Check Posterior Tibialis and Dorsalis pulse intact bilaterally: Yes Comments    Lab Results  Component Value Date   MICROALBUR 2.5 (H) 11/30/2012     Known OSA - difficulty tolerating CPAP machine.   ED - trouble maintaining erection. Difficulty noted for the past year.      Relevant past medical, surgical, family and social history reviewed and updated as indicated. Interim medical history since our last visit reviewed. Allergies and medications reviewed and updated. Outpatient Medications Prior to Visit  Medication Sig Dispense Refill   Blood Glucose Monitoring Suppl (ONE TOUCH ULTRA MINI) w/Device KIT Use as directed 1 each 0   glucose blood (ONE TOUCH ULTRA TEST) test strip CHECK TWICE DAILY 200 each 3   atorvastatin (LIPITOR) 40 MG tablet TAKE 1 TABLET BY MOUTH EVERYDAY AT BEDTIME 90 tablet 0   glimepiride (AMARYL) 4 MG tablet TAKE 1 TABLET BY MOUTH EVERY DAY 30 tablet 0   losartan (COZAAR) 100 MG tablet TAKE 1 TABLET BY MOUTH EVERY DAY 90 tablet 0   metFORMIN (GLUCOPHAGE-XR) 750 MG 24 hr tablet TAKE 2 TABLETS (1,500 MG TOTAL) BY MOUTH DAILY WITH BREAKFAST. 180 tablet 0   sitaGLIPtin (JANUVIA) 100 MG tablet Take 1 tablet (100 mg total) by mouth daily. 90 tablet 1   No facility-administered medications prior to visit.     Per HPI unless specifically indicated in ROS section below Review of Systems  Objective:  BP (!)Marland Kitchen  190/110 (BP Location: Right Arm, Cuff Size: Large)   Pulse (!) 114   Temp 98.1 F (36.7 C) (Temporal)   Ht 5' 11"  (1.803 m)   Wt (!) 310 lb (140.6 kg)   SpO2 98%   BMI 43.24 kg/m   Wt Readings from Last 3 Encounters:  01/08/21 (!) 310 lb (140.6 kg)  01/31/20 299 lb 6 oz (135.8 kg)  04/30/19 (!) 305 lb 7 oz (138.5 kg)      Physical Exam Vitals and nursing note reviewed.  Constitutional:      Appearance: Normal appearance. He is obese. He is not ill-appearing.  Eyes:     Extraocular Movements: Extraocular movements intact.      Conjunctiva/sclera: Conjunctivae normal.     Pupils: Pupils are equal, round, and reactive to light.  Cardiovascular:     Rate and Rhythm: Regular rhythm. Tachycardia present.     Pulses: Normal pulses.     Heart sounds: Normal heart sounds. No murmur heard. Pulmonary:     Effort: Pulmonary effort is normal. No respiratory distress.     Breath sounds: Normal breath sounds. No wheezing, rhonchi or rales.  Musculoskeletal:     Right lower leg: No edema.     Left lower leg: No edema.     Comments: See HPI for foot exam if done  Skin:    General: Skin is warm and dry.     Findings: No rash.  Neurological:     Mental Status: He is alert.  Psychiatric:        Mood and Affect: Mood normal.        Behavior: Behavior normal.      Results for orders placed or performed in visit on 01/08/21  POCT glycosylated hemoglobin (Hb A1C)  Result Value Ref Range   Hemoglobin A1C 8.3 (A) 4.0 - 5.6 %   HbA1c POC (<> result, manual entry)     HbA1c, POC (prediabetic range)     HbA1c, POC (controlled diabetic range)     EKG - sinus tachycardia rate 100s, normal axis, intervals, no acute ST/T changes  Assessment & Plan:  This visit occurred during the SARS-CoV-2 public health emergency.  Safety protocols were in place, including screening questions prior to the visit, additional usage of staff PPE, and extensive cleaning of exam room while observing appropriate contact time as indicated for disinfecting solutions.   Problem List Items Addressed This Visit     Type 2 diabetes mellitus with moderate nonproliferative diabetic retinopathy of right eye without macular edema ([place])    Overdue for eye exam - advised call to schedule.       Relevant Medications   losartan-hydrochlorothiazide (HYZAAR) 100-12.5 MG tablet   atorvastatin (LIPITOR) 40 MG tablet   glimepiride (AMARYL) 4 MG tablet   metFORMIN (GLUCOPHAGE-XR) 750 MG 24 hr tablet   Semaglutide,0.25 or 0.5MG/DOS, (OZEMPIC, 0.25 OR 0.5  MG/DOSE,) 2 MG/1.5ML SOPN   Hyperlipidemia associated with type 2 diabetes mellitus (HCC)    Restart atorvastatin. Update FLP.       Relevant Medications   losartan-hydrochlorothiazide (HYZAAR) 100-12.5 MG tablet   atorvastatin (LIPITOR) 40 MG tablet   glimepiride (AMARYL) 4 MG tablet   metFORMIN (GLUCOPHAGE-XR) 750 MG 24 hr tablet   Semaglutide,0.25 or 0.5MG/DOS, (OZEMPIC, 0.25 OR 0.5 MG/DOSE,) 2 MG/1.5ML SOPN   Severe obesity (BMI >= 40) (HCC)    Weight gain noted. Restart ozempic.       Relevant Medications   glimepiride (AMARYL) 4 MG tablet  metFORMIN (GLUCOPHAGE-XR) 750 MG 24 hr tablet   Semaglutide,0.25 or 0.5MG/DOS, (OZEMPIC, 0.25 OR 0.5 MG/DOSE,) 2 MG/1.5ML SOPN   Hypertension    Chronic, deteriorated, with hypertensive urgency today.  Has been off losartan for the past few months.  Will restart combo losartan/hctz 100/12.57m daily, advised start 1/2 tab daily for 1 wk then increase to full dose.  BP log card provided for him to track at home.  RTC 2-3 mo HTN f/u visit.  Update EKG today.       Relevant Medications   losartan-hydrochlorothiazide (HYZAAR) 100-12.5 MG tablet   atorvastatin (LIPITOR) 40 MG tablet   sildenafil (VIAGRA) 100 MG tablet   Other Relevant Orders   EKG 12-Lead (Completed)   Renal insufficiency    Update renal panel.      Vitamin D deficiency    Update vit D levels, not on replacement.       Relevant Orders   VITAMIN D 25 Hydroxy (Vit-D Deficiency, Fractures)   Diabetes mellitus type 2, uncontrolled, with complications (HCC) - Primary    Chronic, deteriorated. Off ozempic for the past 3 wks. All meds refilled. RTC 2-3 mo CPE/DM f/u.       Relevant Medications   losartan-hydrochlorothiazide (HYZAAR) 100-12.5 MG tablet   atorvastatin (LIPITOR) 40 MG tablet   glimepiride (AMARYL) 4 MG tablet   metFORMIN (GLUCOPHAGE-XR) 750 MG 24 hr tablet   Semaglutide,0.25 or 0.5MG/DOS, (OZEMPIC, 0.25 OR 0.5 MG/DOSE,) 2 MG/1.5ML SOPN   Other Relevant  Orders   POCT glycosylated hemoglobin (Hb A1C) (Completed)   CBC with Differential/Platelet     Meds ordered this encounter  Medications   losartan-hydrochlorothiazide (HYZAAR) 100-12.5 MG tablet    Sig: Take 1 tablet by mouth daily.    Dispense:  90 tablet    Refill:  3   atorvastatin (LIPITOR) 40 MG tablet    Sig: Take 1 tablet (40 mg total) by mouth daily.    Dispense:  90 tablet    Refill:  3   glimepiride (AMARYL) 4 MG tablet    Sig: Take 1 tablet (4 mg total) by mouth daily. Always administer with food    Dispense:  90 tablet    Refill:  3   metFORMIN (GLUCOPHAGE-XR) 750 MG 24 hr tablet    Sig: Take 2 tablets (1,500 mg total) by mouth daily with breakfast.    Dispense:  180 tablet    Refill:  3    Needs annual physical appointment for additional refills.   Semaglutide,0.25 or 0.5MG/DOS, (OZEMPIC, 0.25 OR 0.5 MG/DOSE,) 2 MG/1.5ML SOPN    Sig: Inject 0.25 mg into the skin once a week for 14 days, THEN 0.5 mg once a week.    Dispense:  4.5 mL    Refill:  4   sildenafil (VIAGRA) 100 MG tablet    Sig: Take 0.5-1 tablets (50-100 mg total) by mouth daily as needed for erectile dysfunction.    Dispense:  5 tablet    Refill:  11   Orders Placed This Encounter  Procedures   Comprehensive metabolic panel   Lipid panel   VITAMIN D 25 Hydroxy (Vit-D Deficiency, Fractures)   TSH   CBC with Differential/Platelet   POCT glycosylated hemoglobin (Hb A1C)   EKG 12-Lead    Patient Instructions  EKG today  Fasting labs today  Keep track of blood pressures at home, bring me log card back to review after starting new blood pressure medicine. Start 1/2 tablet of new blood pressure medicine (losartan/hctz)  for 1 week prior to increasing to full dose.  Call to schedule eye exam.  Try viagra 1/2-1 tablet as needed.  Restart all medicines -sent to pharmacy.  Return in 2-3 months for follow up visit   Follow up plan: Return in about 3 months (around 04/10/2021) for follow up visit,  annual exam, prior fasting for blood work.  Ria Bush, MD

## 2021-01-08 NOTE — Assessment & Plan Note (Signed)
Chronic, deteriorated. Off ozempic for the past 3 wks. All meds refilled. RTC 2-3 mo CPE/DM f/u.

## 2021-01-08 NOTE — Assessment & Plan Note (Signed)
Restart atorvastatin. Update FLP.

## 2021-01-08 NOTE — Patient Instructions (Addendum)
EKG today  Fasting labs today  Keep track of blood pressures at home, bring me log card back to review after starting new blood pressure medicine. Start 1/2 tablet of new blood pressure medicine (losartan/hctz) for 1 week prior to increasing to full dose.  Call to schedule eye exam.  Try viagra 1/2-1 tablet as needed.  Restart all medicines -sent to pharmacy.  Return in 2-3 months for follow up visit

## 2021-01-09 LAB — CBC WITH DIFFERENTIAL/PLATELET
Basophils Absolute: 0.1 10*3/uL (ref 0.0–0.1)
Basophils Relative: 0.9 % (ref 0.0–3.0)
Eosinophils Absolute: 0.2 10*3/uL (ref 0.0–0.7)
Eosinophils Relative: 1.4 % (ref 0.0–5.0)
HCT: 39 % (ref 39.0–52.0)
Hemoglobin: 12.8 g/dL — ABNORMAL LOW (ref 13.0–17.0)
Lymphocytes Relative: 17.9 % (ref 12.0–46.0)
Lymphs Abs: 2.1 10*3/uL (ref 0.7–4.0)
MCHC: 32.8 g/dL (ref 30.0–36.0)
MCV: 86.6 fl (ref 78.0–100.0)
Monocytes Absolute: 0.7 10*3/uL (ref 0.1–1.0)
Monocytes Relative: 5.5 % (ref 3.0–12.0)
Neutro Abs: 8.9 10*3/uL — ABNORMAL HIGH (ref 1.4–7.7)
Neutrophils Relative %: 74.3 % (ref 43.0–77.0)
Platelets: 321 10*3/uL (ref 150.0–400.0)
RBC: 4.51 Mil/uL (ref 4.22–5.81)
RDW: 14 % (ref 11.5–15.5)
WBC: 12 10*3/uL — ABNORMAL HIGH (ref 4.0–10.5)

## 2021-01-09 LAB — COMPREHENSIVE METABOLIC PANEL
ALT: 12 U/L (ref 0–53)
AST: 13 U/L (ref 0–37)
Albumin: 3.2 g/dL — ABNORMAL LOW (ref 3.5–5.2)
Alkaline Phosphatase: 94 U/L (ref 39–117)
BUN: 22 mg/dL (ref 6–23)
CO2: 26 mEq/L (ref 19–32)
Calcium: 8.9 mg/dL (ref 8.4–10.5)
Chloride: 106 mEq/L (ref 96–112)
Creatinine, Ser: 2.18 mg/dL — ABNORMAL HIGH (ref 0.40–1.50)
GFR: 36.35 mL/min — ABNORMAL LOW (ref >60.00)
Glucose, Bld: 148 mg/dL — ABNORMAL HIGH (ref 70–99)
Potassium: 3.9 mEq/L (ref 3.5–5.1)
Sodium: 142 mEq/L (ref 135–145)
Total Bilirubin: 0.6 mg/dL (ref 0.2–1.2)
Total Protein: 6.8 g/dL (ref 6.0–8.3)

## 2021-01-09 LAB — LIPID PANEL
Cholesterol: 232 mg/dL — ABNORMAL HIGH (ref 0–200)
HDL: 31.9 mg/dL — ABNORMAL LOW (ref >39.00)
NonHDL: 200.02
Total CHOL/HDL Ratio: 7
Triglycerides: 225 mg/dL — ABNORMAL HIGH (ref 0.0–149.0)
VLDL: 45 mg/dL — ABNORMAL HIGH (ref 0.0–40.0)

## 2021-01-09 LAB — VITAMIN D 25 HYDROXY (VIT D DEFICIENCY, FRACTURES): VITD: <7 ng/mL — ABNORMAL LOW (ref 30.00–100.00)

## 2021-01-09 LAB — TSH: TSH: 2.82 u[IU]/mL (ref 0.35–5.50)

## 2021-01-09 LAB — LDL CHOLESTEROL, DIRECT: Direct LDL: 159 mg/dL

## 2021-01-13 ENCOUNTER — Other Ambulatory Visit: Payer: Self-pay | Admitting: Family Medicine

## 2021-01-13 DIAGNOSIS — N1832 Chronic kidney disease, stage 3b: Secondary | ICD-10-CM

## 2021-01-13 MED ORDER — VITAMIN D3 1.25 MG (50000 UT) PO TABS: 1.0000 | ORAL_TABLET | ORAL | 3 refills | Status: DC

## 2021-04-17 ENCOUNTER — Other Ambulatory Visit: Payer: 59

## 2021-04-24 ENCOUNTER — Ambulatory Visit (INDEPENDENT_AMBULATORY_CARE_PROVIDER_SITE_OTHER): Payer: 59 | Admitting: Family Medicine

## 2021-04-24 ENCOUNTER — Encounter: Payer: Self-pay | Admitting: Family Medicine

## 2021-04-24 ENCOUNTER — Other Ambulatory Visit: Payer: Self-pay

## 2021-04-24 VITALS — BP 178/102 | HR 94 | Temp 97.5°F | Ht 70.5 in | Wt 304.5 lb

## 2021-04-24 DIAGNOSIS — N1832 Chronic kidney disease, stage 3b: Secondary | ICD-10-CM

## 2021-04-24 DIAGNOSIS — N183 Chronic kidney disease, stage 3 unspecified: Secondary | ICD-10-CM

## 2021-04-24 DIAGNOSIS — Z0000 Encounter for general adult medical examination without abnormal findings: Secondary | ICD-10-CM | POA: Diagnosis not present

## 2021-04-24 DIAGNOSIS — E559 Vitamin D deficiency, unspecified: Secondary | ICD-10-CM | POA: Diagnosis not present

## 2021-04-24 DIAGNOSIS — Z0001 Encounter for general adult medical examination with abnormal findings: Secondary | ICD-10-CM | POA: Diagnosis not present

## 2021-04-24 DIAGNOSIS — E1169 Type 2 diabetes mellitus with other specified complication: Secondary | ICD-10-CM

## 2021-04-24 DIAGNOSIS — E118 Type 2 diabetes mellitus with unspecified complications: Secondary | ICD-10-CM | POA: Diagnosis not present

## 2021-04-24 DIAGNOSIS — E113391 Type 2 diabetes mellitus with moderate nonproliferative diabetic retinopathy without macular edema, right eye: Secondary | ICD-10-CM | POA: Diagnosis not present

## 2021-04-24 DIAGNOSIS — N179 Acute kidney failure, unspecified: Secondary | ICD-10-CM

## 2021-04-24 DIAGNOSIS — E785 Hyperlipidemia, unspecified: Secondary | ICD-10-CM | POA: Diagnosis not present

## 2021-04-24 DIAGNOSIS — I10 Essential (primary) hypertension: Secondary | ICD-10-CM

## 2021-04-24 DIAGNOSIS — E1122 Type 2 diabetes mellitus with diabetic chronic kidney disease: Secondary | ICD-10-CM

## 2021-04-24 MED ORDER — AMLODIPINE BESYLATE 5 MG PO TABS: 5.0000 mg | ORAL_TABLET | Freq: Every day | ORAL | 3 refills | Status: DC

## 2021-04-24 MED ORDER — LOSARTAN POTASSIUM 100 MG PO TABS: 100.0000 mg | ORAL_TABLET | Freq: Every day | ORAL | 3 refills | Status: DC

## 2021-04-24 NOTE — Patient Instructions (Addendum)
Labs today  Change blood pressure medicine - new meds will be losartan 100mg  daily and amlodipine 5mg  daily.  Return in 1 month for blood pressure follow up.  Return as needed or in 1 month for follow up visit.   Health Maintenance, Male Adopting a healthy lifestyle and getting preventive care are important in promoting health and wellness. Ask your health care provider about: The right schedule for you to have regular tests and exams. Things you can do on your own to prevent diseases and keep yourself healthy. What should I know about diet, weight, and exercise? Eat a healthy diet  Eat a diet that includes plenty of vegetables, fruits, low-fat dairy products, and lean protein. Do not eat a lot of foods that are high in solid fats, added sugars, or sodium. Maintain a healthy weight Body mass index (BMI) is a measurement that can be used to identify possible weight problems. It estimates body fat based on height and weight. Your health care provider can help determine your BMI and help you achieve or maintain a healthy weight. Get regular exercise Get regular exercise. This is one of the most important things you can do for your health. Most adults should: Exercise for at least 150 minutes each week. The exercise should increase your heart rate and make you sweat (moderate-intensity exercise). Do strengthening exercises at least twice a week. This is in addition to the moderate-intensity exercise. Spend less time sitting. Even light physical activity can be beneficial. Watch cholesterol and blood lipids Have your blood tested for lipids and cholesterol at 43 years of age, then have this test every 5 years. You may need to have your cholesterol levels checked more often if: Your lipid or cholesterol levels are high. You are older than 44 years of age. You are at high risk for heart disease. What should I know about cancer screening? Many types of cancers can be detected early and may often  be prevented. Depending on your health history and family history, you may need to have cancer screening at various ages. This may include screening for: Colorectal cancer. Prostate cancer. Skin cancer. Lung cancer. What should I know about heart disease, diabetes, and high blood pressure? Blood pressure and heart disease High blood pressure causes heart disease and increases the risk of stroke. This is more likely to develop in people who have high blood pressure readings or are overweight. Talk with your health care provider about your target blood pressure readings. Have your blood pressure checked: Every 3-5 years if you are 77-87 years of age. Every year if you are 83 years old or older. If you are between the ages of 74 and 65 and are a current or former smoker, ask your health care provider if you should have a one-time screening for abdominal aortic aneurysm (AAA). Diabetes Have regular diabetes screenings. This checks your fasting blood sugar level. Have the screening done: Once every three years after age 62 if you are at a normal weight and have a low risk for diabetes. More often and at a younger age if you are overweight or have a high risk for diabetes. What should I know about preventing infection? Hepatitis B If you have a higher risk for hepatitis B, you should be screened for this virus. Talk with your health care provider to find out if you are at risk for hepatitis B infection. Hepatitis C Blood testing is recommended for: Everyone born from [birth year] through 1963. Anyone with known risk  factors for hepatitis C. Sexually transmitted infections (STIs) You should be screened each year for STIs, including gonorrhea and chlamydia, if: You are sexually active and are younger than 43 years of age. You are older than 43 years of age and your health care provider tells you that you are at risk for this type of infection. Your sexual activity has changed since you were last  screened, and you are at increased risk for chlamydia or gonorrhea. Ask your health care provider if you are at risk. Ask your health care provider about whether you are at high risk for HIV. Your health care provider may recommend a prescription medicine to help prevent HIV infection. If you choose to take medicine to prevent HIV, you should first get tested for HIV. You should then be tested every 3 months for as long as you are taking the medicine. Follow these instructions at home: Alcohol use Do not drink alcohol if your health care provider tells you not to drink. If you drink alcohol: Limit how much you have to 0-2 drinks a day. Know how much alcohol is in your drink. In the [place] one drink equals one 12 oz bottle of beer (355 mL), one 5 oz glass of wine (148 mL), or one 1 oz glass of hard liquor (44 mL). Lifestyle Do not use any products that contain nicotine or tobacco. These products include cigarettes, chewing tobacco, and vaping devices, such as e-cigarettes. If you need help quitting, ask your health care provider. Do not use street drugs. Do not share needles. Ask your health care provider for help if you need support or information about quitting drugs. General instructions Schedule regular health, dental, and eye exams. Stay current with your vaccines. Tell your health care provider if: You often feel depressed. You have ever been abused or do not feel safe at home. Summary Adopting a healthy lifestyle and getting preventive care are important in promoting health and wellness. Follow your health care provider's instructions about healthy diet, exercising, and getting tested or screened for diseases. Follow your health care provider's instructions on monitoring your cholesterol and blood pressure. This information is not intended to replace advice given to you by your health care provider. Make sure you discuss any questions you have with your health care provider. Document  Revised: 09/18/2020 Document Reviewed: 09/18/2020 Elsevier Patient Education  2022 [employer].

## 2021-04-24 NOTE — Progress Notes (Signed)
Patient ID: Jason Whitney, male    DOB: [birth year], 45 y.o.   MRN: [id number]  This visit was conducted in person.  BP (!) 178/102    Pulse 94    Temp (!) 97.5 F (36.4 C) (Temporal)    Ht 5' 10.5" (1.791 m)    Wt (!) 304 lb 8 oz (138.1 kg)    SpO2 98%    BMI 43.07 kg/m    CC: CPE  Subjective:   HPI: Jason Whitney is a 45 y.o. male presenting on 04/24/2021 for Annual Exam (Wants to discuss wt loss surgery options. )   Gets yearly DOT.  See prior note for details. Last seen 12/2020, at that time had been off all meds for months. We restarted hyzaar 100/12.67m, atorvastatin 427mdaily, amaryl 14m46maily, and metformin XR 1500m63mily, also started ozempic.   Cr 1.3 --> 2.2 (12/2020) - did not return for recheck.   He reports taking losartan/hctz 100/12.5mg 10mly but does alternate times he takes it.   He's been taking metformin, ozempic and amaryl regularly.   Has been taking vitamin D weekly.   Preventative: No fmhx prostate or colon cancer  Flu shot - declines COVID vaccine - declines Pneumovax 11/2012 Tdap 2012, declines for now Seat belt use discussed. Sunscreen use discussed. No changing moles on skin. Non smoker Alcohol - rare  Dentist - q6 mo Eye exam yearly - upcoming appt 05/2021.   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 Interim medical history since our last visit reviewed. Allergies and medications reviewed and updated. Outpatient Medications Prior to Visit  Medication Sig Dispense Refill   atorvastatin (LIPITOR) 40 MG tablet Take 1 tablet (40 mg total) by mouth daily. 90 tablet 3   Blood Glucose Monitoring Suppl (ONE TOUCH ULTRA MINI) w/Device KIT Use as directed 1 each 0   Cholecalciferol (VITAMIN D3) 1.25 MG (50000 UT) TABS  Take 1 tablet by mouth once a week. 12 tablet 3   glimepiride (AMARYL) 4 MG tablet Take 1 tablet (4 mg total) by mouth daily. Always administer with food 90 tablet 3   glucose blood (ONE TOUCH ULTRA TEST) test strip CHECK TWICE DAILY 200 each 3   metFORMIN (GLUCOPHAGE-XR) 750 MG 24 hr tablet Take 2 tablets (1,500 mg total) by mouth daily with breakfast. 180 tablet 3   sildenafil (VIAGRA) 100 MG tablet Take 0.5-1 tablets (50-100 mg total) by mouth daily as needed for erectile dysfunction. 5 tablet 11   losartan-hydrochlorothiazide (HYZAAR) 100-12.5 MG tablet Take 1 tablet by mouth daily. 90 tablet 3   Semaglutide,0.25 or 0.5MG/DOS, (OZEMPIC, 0.25 OR 0.5 MG/DOSE,) 2 MG/1.5ML SOPN Inject 0.5 mg into the skin once a week. 6 mL    No facility-administered medications prior to visit.     Per HPI unless specifically indicated in ROS section below Review of Systems  Constitutional:  Negative for activity change, appetite change, chills, fatigue, fever and unexpected weight change.  HENT:  Negative for hearing loss.   Eyes:  Negative for visual disturbance.  Respiratory:  Negative for cough, chest tightness, shortness of breath and wheezing.   Cardiovascular:  Negative for chest pain, palpitations and leg swelling.  Gastrointestinal:  Negative for abdominal distention, abdominal pain, blood in stool, constipation, diarrhea,  nausea and vomiting.  Genitourinary:  Negative for difficulty urinating and hematuria.  Musculoskeletal:  Negative for arthralgias, myalgias and neck pain.  Skin:  Negative for rash.  Neurological:  Negative for dizziness, seizures, syncope and headaches.  Hematological:  Negative for adenopathy. Does not bruise/bleed easily.  Psychiatric/Behavioral:  Negative for dysphoric mood. The patient is not nervous/anxious.    Objective:  BP (!) 178/102    Pulse 94    Temp (!) 97.5 F (36.4 C) (Temporal)    Ht 5' 10.5" (1.791 m)    Wt (!) 304 lb 8 oz (138.1 kg)    SpO2 98%    BMI 43.07  kg/m   Wt Readings from Last 3 Encounters:  04/24/21 (!) 304 lb 8 oz (138.1 kg)  01/08/21 (!) 310 lb (140.6 kg)  01/31/20 299 lb 6 oz (135.8 kg)      Physical Exam Vitals and nursing note reviewed.  Constitutional:      General: He is not in acute distress.    Appearance: Normal appearance. He is well-developed. He is obese. He is not ill-appearing.  HENT:     Head: Normocephalic and atraumatic.     Right Ear: Hearing, tympanic membrane, ear canal and external ear normal.     Left Ear: Hearing, tympanic membrane, ear canal and external ear normal.  Eyes:     General: No scleral icterus.    Extraocular Movements: Extraocular movements intact.     Conjunctiva/sclera: Conjunctivae normal.     Pupils: Pupils are equal, round, and reactive to light.  Neck:     Thyroid: No thyroid mass or thyromegaly.  Cardiovascular:     Rate and Rhythm: Normal rate and regular rhythm.     Pulses: Normal pulses.          Radial pulses are 2+ on the right side and 2+ on the left side.     Heart sounds: Normal heart sounds. No murmur heard. Pulmonary:     Effort: Pulmonary effort is normal. No respiratory distress.     Breath sounds: Normal breath sounds. No wheezing, rhonchi or rales.  Abdominal:     General: Bowel sounds are normal. There is no distension.     Palpations: Abdomen is soft. There is no mass.     Tenderness: There is no abdominal tenderness. There is no guarding or rebound.     Hernia: No hernia is present.  Musculoskeletal:        General: Normal range of motion.     Cervical back: Normal range of motion and neck supple.     Right lower leg: No edema.     Left lower leg: No edema.  Lymphadenopathy:     Cervical: No cervical adenopathy.  Skin:    General: Skin is warm and dry.     Findings: No rash.  Neurological:     General: No focal deficit present.     Mental Status: He is alert and oriented to person, place, and time.  Psychiatric:        Mood and Affect: Mood normal.         Behavior: Behavior normal.        Thought Content: Thought content normal.        Judgment: Judgment normal.      Results for orders placed or performed in visit on 01/08/21  Comprehensive metabolic panel  Result Value Ref Range   Sodium 142 135 - 145 mEq/L   Potassium 3.9 3.5 - 5.1 mEq/L  Chloride 106 96 - 112 mEq/L   CO2 26 19 - 32 mEq/L   Glucose, Bld 148 (H) 70 - 99 mg/dL   BUN 22 6 - 23 mg/dL   Creatinine, Ser 2.18 (H) 0.40 - 1.50 mg/dL   Total Bilirubin 0.6 0.2 - 1.2 mg/dL   Alkaline Phosphatase 94 39 - 117 U/L   AST 13 0 - 37 U/L   ALT 12 0 - 53 U/L   Total Protein 6.8 6.0 - 8.3 g/dL   Albumin 3.2 (L) 3.5 - 5.2 g/dL   GFR 36.35 (L) >60.00 mL/min   Calcium 8.9 8.4 - 10.5 mg/dL  Lipid panel  Result Value Ref Range   Cholesterol 232 (H) 0 - 200 mg/dL   Triglycerides 225.0 (H) 0.0 - 149.0 mg/dL   HDL 31.90 (L) >39.00 mg/dL   VLDL 45.0 (H) 0.0 - 40.0 mg/dL   Total CHOL/HDL Ratio 7    NonHDL 200.02   VITAMIN D 25 Hydroxy (Vit-D Deficiency, Fractures)  Result Value Ref Range   VITD <7.00 (L) 30.00 - 100.00 ng/mL  TSH  Result Value Ref Range   TSH 2.82 0.35 - 5.50 uIU/mL  CBC with Differential/Platelet  Result Value Ref Range   WBC 12.0 (H) 4.0 - 10.5 K/uL   RBC 4.51 4.22 - 5.81 Mil/uL   Hemoglobin 12.8 (L) 13.0 - 17.0 g/dL   HCT 39.0 39.0 - 52.0 %   MCV 86.6 78.0 - 100.0 fl   MCHC 32.8 30.0 - 36.0 g/dL   RDW 14.0 11.5 - 15.5 %   Platelets 321.0 150.0 - 400.0 K/uL   Neutrophils Relative % 74.3 43.0 - 77.0 %   Lymphocytes Relative 17.9 12.0 - 46.0 %   Monocytes Relative 5.5 3.0 - 12.0 %   Eosinophils Relative 1.4 0.0 - 5.0 %   Basophils Relative 0.9 0.0 - 3.0 %   Neutro Abs 8.9 (H) 1.4 - 7.7 K/uL   Lymphs Abs 2.1 0.7 - 4.0 K/uL   Monocytes Absolute 0.7 0.1 - 1.0 K/uL   Eosinophils Absolute 0.2 0.0 - 0.7 K/uL   Basophils Absolute 0.1 0.0 - 0.1 K/uL  LDL cholesterol, direct  Result Value Ref Range   Direct LDL 159.0 mg/dL  POCT glycosylated hemoglobin  (Hb A1C)  Result Value Ref Range   Hemoglobin A1C 8.3 (A) 4.0 - 5.6 %   HbA1c POC (<> result, manual entry)     HbA1c, POC (prediabetic range)     HbA1c, POC (controlled diabetic range)      Assessment & Plan:  This visit occurred during the SARS-CoV-2 public health emergency.  Safety protocols were in place, including screening questions prior to the visit, additional usage of staff PPE, and extensive cleaning of exam room while observing appropriate contact time as indicated for disinfecting solutions.   Problem List Items Addressed This Visit     Encounter for general adult medical examination with abnormal findings - Primary (Chronic)    Preventative protocols reviewed and updated unless pt declined. Discussed healthy diet and lifestyle.       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       Relevant Medications   losartan (COZAAR) 100 MG tablet   Semaglutide,0.25 or 0.5MG/DOS, (OZEMPIC, 0.25 OR 0.5 MG/DOSE,) 2 MG/1.5ML SOPN   Hyperlipidemia associated with type 2 diabetes mellitus ([place])    He is  regularly taking atorvastatin 39m daily - update FLP. The 10-year ASCVD risk score (Arnett DK, et al., 2019) is: 23.8%   Values used to calculate the score:     Age: [age]years     Sex: Male     Is Non-Hispanic African American: Yes     Diabetic: Yes     Tobacco smoker: No     Systolic Blood Pressure: 1154mmHg     Is BP treated: Yes     HDL Cholesterol: 31.9 mg/dL     Total Cholesterol: 232 mg/dL       Relevant Medications   losartan (COZAAR) 100 MG tablet   amLODipine (NORVASC) 5 MG tablet   Semaglutide,0.25 or 0.5MG/DOS, (OZEMPIC, 0.25 OR 0.5 MG/DOSE,) 2 MG/1.5ML SOPN   Other Relevant Orders   Lipid panel   Severe obesity (BMI >= 40) ([facility])    6 lb weight loss since last visit, continue ozempic.  He may be interested in bariatric  surgery - discussed need to control blood pressures prior to referring.       Relevant Medications   Semaglutide,0.25 or 0.5MG/DOS, (OZEMPIC, 0.25 OR 0.5 MG/DOSE,) 2 MG/1.5ML SOPN   Hypertension    Chronically uncontrolled.  He reports compliance with hyzaar 100/12.529m unsure how effective thiazide is given deteriorated kidney function. Will stop hctz and start amlodipine 5m75maily. RTC 3 wks f/u visit.      Relevant Medications   losartan (COZAAR) 100 MG tablet   amLODipine (NORVASC) 5 MG tablet   [place]ronic [place]  He did not return for repeat labs earlier this year.  Update renal panel after recent Cr rise to 2.2.       Relevant Orders   Renal function panel   Vitamin D deficiency    Update levels after taking weekly replacement for almost 4 months.      Relevant Orders   VITAMIN D 25 Hydroxy (Vit-D Deficiency, Fractures)   Type II diabetes mellitus with complication (HCC)    Chronic, uncontrolled. Update A1c.  He reports compliance with metformin 1500m22m daily, amaryl 4mg 60mly, ozempic 0.5mg w69mly.       Relevant Medications   losartan (COZAAR) 100 MG tablet   Semaglutide,0.25 or 0.5MG/DOS, (OZEMPIC, 0.25 OR 0.5 MG/DOSE,) 2 MG/1.5ML SOPN   Other Relevant Orders   Hemoglobin A1c   CKD stage 3 due to type 2 diabetes mellitus (HCC)  Bowluseviewed importance of sugar and blood pressure control to prevent progession of kidney disease      Relevant Medications   losartan (COZAAR) 100 MG tablet   Semaglutide,0.25 or 0.5MG/DOS, (OZEMPIC, 0.25 OR 0.5 MG/DOSE,) 2 MG/1.5ML SOPN     Meds ordered this encounter  Medications   losartan (COZAAR) 100 MG tablet    Sig: Take 1 tablet (100 mg total) by mouth daily.    Dispense:  90 tablet    Refill:  3    In place of losartan hctz   amLODipine (NORVASC) 5 MG tablet    Sig: Take 1 tablet (5 mg total) by mouth daily.    Dispense:  90 tablet    Refill:  3    In addition to losartan   Orders Placed This  Encounter  Procedures   VITAMIN D 25 Hydroxy (Vit-D Deficiency, Fractures)   Lipid panel   Renal function panel   Hemoglobin A1c    Patient instructions: Labs today  Change blood pressure medicine - new meds will be losartan 100mg d62m  and amlodipine 37m daily.  Return in 1 month for blood pressure follow up.  Return as needed or in 1 month for follow up visit.   Follow up plan: Return in about 4 weeks (around 05/22/2021) for follow up visit.  JRia Bush MD

## 2021-04-25 DIAGNOSIS — E1122 Type 2 diabetes mellitus with diabetic chronic kidney disease: Secondary | ICD-10-CM | POA: Insufficient documentation

## 2021-04-25 LAB — RENAL FUNCTION PANEL
Albumin: 3.8 g/dL (ref 3.5–5.2)
BUN: 37 mg/dL — ABNORMAL HIGH (ref 6–23)
CO2: 24 mEq/L (ref 19–32)
Calcium: 9.6 mg/dL (ref 8.4–10.5)
Chloride: 108 mEq/L (ref 96–112)
Creatinine, Ser: 2.3 mg/dL — ABNORMAL HIGH (ref 0.40–1.50)
GFR: 34.01 mL/min — ABNORMAL LOW (ref >60.00)
Glucose, Bld: 68 mg/dL — ABNORMAL LOW (ref 70–99)
Phosphorus: 3.5 mg/dL (ref 2.3–4.6)
Potassium: 4.3 mEq/L (ref 3.5–5.1)
Sodium: 141 mEq/L (ref 135–145)

## 2021-04-25 LAB — HEMOGLOBIN A1C: Hgb A1c MFr Bld: 7.2 % — ABNORMAL HIGH (ref 4.6–6.5)

## 2021-04-25 LAB — LIPID PANEL
Cholesterol: 166 mg/dL (ref 0–200)
HDL: 29.6 mg/dL — ABNORMAL LOW (ref >39.00)
LDL Cholesterol: 103 mg/dL — ABNORMAL HIGH (ref 0–99)
NonHDL: 136.06
Total CHOL/HDL Ratio: 6
Triglycerides: 167 mg/dL — ABNORMAL HIGH (ref 0.0–149.0)
VLDL: 33.4 mg/dL (ref 0.0–40.0)

## 2021-04-25 LAB — VITAMIN D 25 HYDROXY (VIT D DEFICIENCY, FRACTURES): VITD: 31.81 ng/mL (ref 30.00–100.00)

## 2021-04-25 NOTE — Assessment & Plan Note (Signed)
Update levels after taking weekly replacement for almost 4 months.

## 2021-04-25 NOTE — Assessment & Plan Note (Signed)
Reviewed importance of sugar and blood pressure control to prevent progession of kidney disease

## 2021-04-25 NOTE — Assessment & Plan Note (Signed)
Overdue for eye exam - has scheduled upcoming appt for 05/2021.

## 2021-04-25 NOTE — Assessment & Plan Note (Signed)
Preventative protocols reviewed and updated unless pt declined. Discussed healthy diet and lifestyle.  

## 2021-04-25 NOTE — Assessment & Plan Note (Signed)
He did not return for repeat labs earlier this year.  Update renal panel after recent Cr rise to 2.2.

## 2021-04-25 NOTE — Assessment & Plan Note (Addendum)
6 lb weight loss since last visit, continue ozempic.  He may be interested in bariatric surgery - discussed need to control blood pressures prior to referring.

## 2021-04-25 NOTE — Assessment & Plan Note (Signed)
Chronically uncontrolled.  He reports compliance with hyzaar 100/12.5mg . unsure how effective thiazide is given deteriorated kidney function. Will stop hctz and start amlodipine 5mg  daily. RTC 3 wks f/u visit.

## 2021-04-25 NOTE — Assessment & Plan Note (Signed)
He is regularly taking atorvastatin 40mg  daily - update FLP. The 10-year ASCVD risk score (Arnett DK, et al., 2019) is: 23.8%   Values used to calculate the score:     Age: 45 years     Sex: Male     Is Non-Hispanic African American: Yes     Diabetic: Yes     Tobacco smoker: No     Systolic Blood Pressure: 178 mmHg     Is BP treated: Yes     HDL Cholesterol: 31.9 mg/dL     Total Cholesterol: 232 mg/dL

## 2021-04-25 NOTE — Assessment & Plan Note (Signed)
Chronic, uncontrolled. Update A1c.  He reports compliance with metformin 1500mg  XR daily, amaryl 4mg  daily, ozempic 0.5mg  weekly.

## 2021-04-26 ENCOUNTER — Other Ambulatory Visit: Payer: Self-pay | Admitting: Family Medicine

## 2021-04-26 DIAGNOSIS — E1122 Type 2 diabetes mellitus with diabetic chronic kidney disease: Secondary | ICD-10-CM

## 2021-04-26 DIAGNOSIS — N1832 Chronic kidney disease, stage 3b: Secondary | ICD-10-CM

## 2021-05-09 ENCOUNTER — Encounter: Payer: Self-pay | Admitting: *Deleted

## 2021-05-18 ENCOUNTER — Other Ambulatory Visit: Payer: Self-pay

## 2021-05-18 ENCOUNTER — Ambulatory Visit (INDEPENDENT_AMBULATORY_CARE_PROVIDER_SITE_OTHER): Payer: 59 | Admitting: Family Medicine

## 2021-05-18 ENCOUNTER — Encounter: Payer: Self-pay | Admitting: Family Medicine

## 2021-05-18 ENCOUNTER — Ambulatory Visit
Admission: RE | Admit: 2021-05-18 | Discharge: 2021-05-18 | Disposition: A | Discharge status: Home / Self Care | Payer: 59 | Source: Ambulatory Visit | Attending: Family Medicine | Admitting: Family Medicine

## 2021-05-18 VITALS — BP 158/98 | HR 87 | Temp 97.4°F | Ht 70.5 in | Wt 312.3 lb

## 2021-05-18 DIAGNOSIS — N183 Chronic kidney disease, stage 3 unspecified: Secondary | ICD-10-CM

## 2021-05-18 DIAGNOSIS — N1832 Chronic kidney disease, stage 3b: Secondary | ICD-10-CM

## 2021-05-18 DIAGNOSIS — E118 Type 2 diabetes mellitus with unspecified complications: Secondary | ICD-10-CM

## 2021-05-18 DIAGNOSIS — E1122 Type 2 diabetes mellitus with diabetic chronic kidney disease: Secondary | ICD-10-CM | POA: Diagnosis not present

## 2021-05-18 DIAGNOSIS — I10 Essential (primary) hypertension: Secondary | ICD-10-CM

## 2021-05-18 DIAGNOSIS — N179 Acute kidney failure, unspecified: Secondary | ICD-10-CM

## 2021-05-18 MED ORDER — AMLODIPINE BESYLATE 10 MG PO TABS: 10.0000 mg | ORAL_TABLET | Freq: Every day | ORAL | 3 refills | Status: DC

## 2021-05-18 NOTE — Assessment & Plan Note (Addendum)
Chronically elevated. Some improvement with recent change from hctz to amlodipine - will increase amlodipine to full dose 10mg  daily, RTC 4-6 wks BP recheck. I also asked him to call renal to schedule appt (referral placed last month).

## 2021-05-18 NOTE — Progress Notes (Signed)
Patient ID: Jason Whitney, male    DOB: Oct 31, 1975, 46 y.o.   MRN: [id number]  This visit was conducted in person.  BP (!) 158/98 (BP Location: Right Arm, Cuff Size: Large)    Pulse 87    Temp (!) 97.4 F (36.3 C) (Temporal)    Ht 5' 10.5" (1.791 m)    Wt (!) 312 lb 5 oz (141.7 kg)    SpO2 97%    BMI 44.18 kg/m   BP Readings from Last 3 Encounters:  05/18/21 (!) 158/98  04/24/21 (!) 178/102  01/08/21 (!) 190/110    CC: 1 mo f/u visit  Subjective:   HPI: Jason Whitney is a 46 y.o. male presenting on 05/18/2021 for Follow-up (1 month/Eye appt scheduled for 06/01/21)   Just had renal ultrasound completed today - results pending.  Upcoming eye exam scheduled this month.  Last visit we referred to nephrology - still has not had appt scheduled.   Hypertension with presumed hypertensive nephropathy with recent acute on chronic kidney failure - last visit we stopped HCTZ and started in its place amlodipine 5m daily, continued losartan 1054mdaily.   DM - he continues metformin 150018mR daily, amaryl 4mg5mily, and ozempic 0.5mg 27mkly. Weight gain noted over holidays. No nausea, constipation with ozempic. Doesn't check sugars at home - [place] - will call for new one.      Relevant past medical, surgical, family and social history reviewed and updated as indicated. Interim medical history since our last visit reviewed. Allergies and medications reviewed and updated. Outpatient Medications Prior to Visit  Medication Sig Dispense Refill   atorvastatin (LIPITOR) 40 MG tablet Take 1 tablet (40 mg total) by mouth daily. 90 tablet 3   Blood Glucose Monitoring Suppl (ONE TOUCH ULTRA MINI) w/Device KIT Use as directed 1 each 0   Cholecalciferol (VITAMIN D3) 1.25 MG (50000 UT) TABS Take 1 tablet by mouth once a week. 12 tablet 3   glimepiride (AMARYL) 4 MG tablet Take 1 tablet (4 mg total) by mouth daily. Always administer with food 90 tablet 3   glucose blood (ONE TOUCH ULTRA  TEST) test strip CHECK TWICE DAILY 200 each 3   losartan (COZAAR) 100 MG tablet Take 1 tablet (100 mg total) by mouth daily. 90 tablet 3   metFORMIN (GLUCOPHAGE-XR) 750 MG 24 hr tablet Take 2 tablets (1,500 mg total) by mouth daily with breakfast. 180 tablet 3   Semaglutide,0.25 or 0.5MG/DOS, (OZEMPIC, 0.25 OR 0.5 MG/DOSE,) 2 MG/1.5ML SOPN Inject 0.5 mg into the skin once a week. 6 mL    sildenafil (VIAGRA) 100 MG tablet Take 0.5-1 tablets (50-100 mg total) by mouth daily as needed for erectile dysfunction. 5 tablet 11   amLODipine (NORVASC) 5 MG tablet Take 1 tablet (5 mg total) by mouth daily. 90 tablet 3   No facility-administered medications prior to visit.     Per HPI unless specifically indicated in ROS section below Review of Systems  Objective:  BP (!) 158/98 (BP Location: Right Arm, Cuff Size: Large)    Pulse 87    Temp (!) 97.4 F (36.3 C) (Temporal)    Ht 5' 10.5" (1.791 m)    Wt (!) 312 lb 5 oz (141.7 kg)    SpO2 97%    BMI 44.18 kg/m   Wt Readings from Last 3 Encounters:  05/18/21 (!) 312 lb 5 oz (141.7 kg)  04/24/21 (!) 304 lb 8 oz (138.1 kg)  01/08/21 (!) 310  lb (140.6 kg)      Physical Exam Vitals and nursing note reviewed.  Constitutional:      Appearance: Normal appearance. He is not ill-appearing.  Eyes:     Extraocular Movements: Extraocular movements intact.     Pupils: Pupils are equal, round, and reactive to light.  Neck:     Thyroid: No thyroid mass or thyromegaly.  Cardiovascular:     Rate and Rhythm: Normal rate and regular rhythm.     Pulses: Normal pulses.     Heart sounds: Normal heart sounds. No murmur heard. Pulmonary:     Effort: Pulmonary effort is normal. No respiratory distress.     Breath sounds: Normal breath sounds. No wheezing, rhonchi or rales.  Musculoskeletal:     Cervical back: Normal range of motion and neck supple. No rigidity.     Right lower leg: No edema.     Left lower leg: No edema.  Lymphadenopathy:     Cervical: No  cervical adenopathy.  Skin:    General: Skin is warm and dry.     Findings: No rash.  Neurological:     Mental Status: He is alert.  Psychiatric:        Mood and Affect: Mood normal.        Behavior: Behavior normal.      Results for orders placed or performed in visit on 04/24/21  VITAMIN D 25 Hydroxy (Vit-D Deficiency, Fractures)  Result Value Ref Range   VITD 31.81 30.00 - 100.00 ng/mL  Lipid panel  Result Value Ref Range   Cholesterol 166 0 - 200 mg/dL   Triglycerides 167.0 (H) 0.0 - 149.0 mg/dL   HDL 29.60 (L) >39.00 mg/dL   VLDL 33.4 0.0 - 40.0 mg/dL   LDL Cholesterol 103 (H) 0 - 99 mg/dL   Total CHOL/HDL Ratio 6    NonHDL 136.06   Renal function panel  Result Value Ref Range   Sodium 141 135 - 145 mEq/L   Potassium 4.3 3.5 - 5.1 mEq/L   Chloride 108 96 - 112 mEq/L   CO2 24 19 - 32 mEq/L   Albumin 3.8 3.5 - 5.2 g/dL   BUN 37 (H) 6 - 23 mg/dL   Creatinine, Ser 2.30 (H) 0.40 - 1.50 mg/dL   Glucose, Bld 68 (L) 70 - 99 mg/dL   Phosphorus 3.5 2.3 - 4.6 mg/dL   GFR 34.01 (L) >60.00 mL/min   Calcium 9.6 8.4 - 10.5 mg/dL  Hemoglobin A1c  Result Value Ref Range   Hgb A1c MFr Bld 7.2 (H) 4.6 - 6.5 %    Assessment & Plan:  This visit occurred during the SARS-CoV-2 public health emergency.  Safety protocols were in place, including screening questions prior to the visit, additional usage of staff PPE, and extensive cleaning of exam room while observing appropriate contact time as indicated for disinfecting solutions.   Problem List Items Addressed This Visit     Severe obesity (BMI >= 40) (HCC)    Weight gain noted over the holidays.  Consider increased ozempic dose, will not increase for now.       Hypertension - Primary    Chronically elevated. Some improvement with recent change from hctz to amlodipine - will increase amlodipine to full dose 11m daily, RTC 4-6 wks BP recheck. I also asked him to call renal to schedule appt (referral placed last month).        Relevant Medications   amLODipine (NORVASC) 10 MG tablet   Type  II diabetes mellitus with complication (HCC)    Chronic, improving control - continue current regimen of metformin, amaryl, ozempic. Poor SGLT2i candidate due to h/o scrotal abscess. Upcoming eye exam.       CKD stage 3 due to type 2 diabetes mellitus ([place])    Pending establishing with nephrology. # provided to call and schedule appointment.         Meds ordered this encounter  Medications   amLODipine (NORVASC) 10 MG tablet    Sig: Take 1 tablet (10 mg total) by mouth daily.    Dispense:  90 tablet    Refill:  3    Note higher dose   No orders of the defined types were placed in this encounter.    Patient Instructions  Increase amlodipine to 54m daily - double up until you run out, new dose for 143mwill be at pharmacy.  [place] [facility] [facility] [facility].  Return in 4-6 weeks for follow up visit.   Call to schedule appointment with kidney doctors: [facility]ress: [id number] [place] [id number]: [phone] Follow up plan: Return in about 4 weeks (around 06/15/2021) for follow up visit.  JaRia BushMD

## 2021-05-18 NOTE — Assessment & Plan Note (Signed)
Weight gain noted over the holidays.  Consider increased ozempic dose, will not increase for now.

## 2021-05-18 NOTE — Assessment & Plan Note (Signed)
Pending establishing with nephrology. # provided to call and schedule appointment.

## 2021-05-18 NOTE — Assessment & Plan Note (Addendum)
Chronic, improving control - continue current regimen of metformin, amaryl, ozempic. Poor SGLT2i candidate due to h/o scrotal abscess. Upcoming eye exam.

## 2021-05-18 NOTE — Patient Instructions (Addendum)
Increase amlodipine to 10mg  daily - double up until you run out, new dose for 10mg  will be at pharmacy.  Call [place] for new glucometer.  Return in 4-6 weeks for follow up visit.   Call to schedule appointment with kidney doctors: [facility] Address: [address] [place], [place] [address] Phone: [phone]

## 2021-06-22 ENCOUNTER — Ambulatory Visit: Payer: 59 | Admitting: Family Medicine

## 2021-11-08 ENCOUNTER — Other Ambulatory Visit: Payer: Self-pay | Admitting: Family Medicine

## 2021-12-04 ENCOUNTER — Other Ambulatory Visit: Payer: Self-pay | Admitting: Family Medicine

## 2021-12-05 NOTE — Telephone Encounter (Signed)
ERx - needs oV

## 2021-12-05 NOTE — Telephone Encounter (Signed)
Please call patient and schedule appointment as instructed. 

## 2021-12-05 NOTE — Telephone Encounter (Signed)
LVM for patient to give [place] a call and schedule follow up.

## 2021-12-05 NOTE — Telephone Encounter (Signed)
Refill request Vitamin D-3 Last refill 01/13/21 #12/3 Last office visit 05/18/21 No show 06/22/21 No upcoming appointment scheduled

## 2022-02-04 LAB — HM DIABETES EYE EXAM

## 2022-02-15 ENCOUNTER — Ambulatory Visit (INDEPENDENT_AMBULATORY_CARE_PROVIDER_SITE_OTHER): Payer: 59 | Admitting: Family Medicine

## 2022-02-15 ENCOUNTER — Encounter: Payer: Self-pay | Admitting: Family Medicine

## 2022-02-15 VITALS — BP 158/88 | HR 89 | Temp 97.5°F | Ht 70.5 in | Wt 307.1 lb

## 2022-02-15 DIAGNOSIS — I10 Essential (primary) hypertension: Secondary | ICD-10-CM

## 2022-02-15 DIAGNOSIS — E1169 Type 2 diabetes mellitus with other specified complication: Secondary | ICD-10-CM | POA: Diagnosis not present

## 2022-02-15 DIAGNOSIS — E785 Hyperlipidemia, unspecified: Secondary | ICD-10-CM

## 2022-02-15 DIAGNOSIS — E118 Type 2 diabetes mellitus with unspecified complications: Secondary | ICD-10-CM | POA: Diagnosis not present

## 2022-02-15 DIAGNOSIS — E113391 Type 2 diabetes mellitus with moderate nonproliferative diabetic retinopathy without macular edema, right eye: Secondary | ICD-10-CM

## 2022-02-15 DIAGNOSIS — N183 Chronic kidney disease, stage 3 unspecified: Secondary | ICD-10-CM

## 2022-02-15 DIAGNOSIS — E1122 Type 2 diabetes mellitus with diabetic chronic kidney disease: Secondary | ICD-10-CM

## 2022-02-15 LAB — POCT GLYCOSYLATED HEMOGLOBIN (HGB A1C): Hemoglobin A1C: 6.3 % — AB (ref 4.0–5.6)

## 2022-02-15 MED ORDER — ATORVASTATIN CALCIUM 40 MG PO TABS: 40.0000 mg | ORAL_TABLET | Freq: Every day | ORAL | 2 refills | Status: DC

## 2022-02-15 MED ORDER — SEMAGLUTIDE (1 MG/DOSE) 4 MG/3ML ~~LOC~~ SOPN: 1.0000 mg | PEN_INJECTOR | SUBCUTANEOUS | 11 refills | Status: DC

## 2022-02-15 MED ORDER — GLIMEPIRIDE 1 MG PO TABS: 1.0000 mg | ORAL_TABLET | Freq: Every day | ORAL | 2 refills | Status: DC

## 2022-02-15 MED ORDER — ONETOUCH ULTRA MINI W/DEVICE KIT: PACK | 0 refills | Status: AC

## 2022-02-15 MED ORDER — GLUCOSE BLOOD VI STRP: ORAL_STRIP | 3 refills | Status: AC

## 2022-02-15 MED ORDER — METFORMIN HCL ER 750 MG PO TB24: 1500.0000 mg | ORAL_TABLET | Freq: Every day | ORAL | 2 refills | Status: DC

## 2022-02-15 MED ORDER — GLIMEPIRIDE 4 MG PO TABS: 4.0000 mg | ORAL_TABLET | Freq: Every day | ORAL | 2 refills | Status: DC

## 2022-02-15 MED ORDER — OZEMPIC (0.25 OR 0.5 MG/DOSE) 2 MG/3ML ~~LOC~~ SOPN: 0.5000 mg | PEN_INJECTOR | SUBCUTANEOUS | 9 refills | Status: DC

## 2022-02-15 NOTE — Assessment & Plan Note (Signed)
Atorvastatin refilled for him.

## 2022-02-15 NOTE — Patient Instructions (Addendum)
Sugars are doing great! Increase ozempic to 1mg  weekly - let us know if any trouble tolerating this. Drop amaryl (glimepiride) to 1 mg daily with dinner. If sugars doing well on higher ozempic dose, may even try coming off amary (glimepiride).  Blood pressures are still too high - possibly from missed doses yesterday. Continue monitoring BP at home, let me know if consistently >140/90.  Return in 3-4 months for physical.

## 2022-02-15 NOTE — Progress Notes (Signed)
Patient ID: Jason Whitney, male    DOB: August 22, 1975, 46 y.o.   MRN: [id number]  This visit was conducted in person.  BP (!) 158/88 (BP Location: Right Arm, Cuff Size: Large)   Pulse 89   Temp (!) 97.5 F (36.4 C) (Temporal)   Ht 5' 10.5" (1.791 m)   Wt (!) 307 lb 2 oz (139.3 kg)   SpO2 100%   BMI 43.44 kg/m   BP Readings from Last 3 Encounters:  02/15/22 (!) 158/88  05/18/21 (!) 158/98  04/24/21 (!) 178/102  At home 146/81. On retesting 158/88   CC: 9 mo f/u visit  Subjective:   HPI: Jason Whitney is a 46 y.o. male presenting on 02/15/2022 for Follow-up (HTN/DM/Will request eye exam from retina specialist. )   Last seen 05/2021, missed appt in interim.   Known hypertension with hypertensive nephropathy - established with nephrologist Dr Candiss Norse started on jardiance and Kerendia 100m daily as well as carvedilol 6.268mbid. Continues amlodipine and losartan. Denies HA, vision changes, CP/tightness, SOB, leg swelling. He missed all BP meds yesterday.   DM - does not regularly check sugars - needs new meter. Compliant with antihyperglycemic regimen which includes: amaryl 53m67maily, metformin XR 1500m19mily, ozepmic 0.5mg 90mkly. Denies low sugars or hypoglycemic symptoms. Denies paresthesias, blurry vision. Last diabetic eye exam - saw retinologist , records requested. Glucometer brand: one touch. Last foot exam: DUE. DSME: not completed ([facility] Results  Component Value Date   HGBA1C 6.3 (A) 02/15/2022   Diabetic Foot Exam - Simple   Simple Foot Form Diabetic Foot exam was performed with the following findings: Yes 02/15/2022  3:54 PM  Visual Inspection No deformities, no ulcerations, no other skin breakdown bilaterally: Yes Sensation Testing Intact to touch and monofilament testing bilaterally: Yes Pulse Check Posterior Tibialis and Dorsalis pulse intact bilaterally: Yes Comments    Lab Results  Component Value Date   MICROALBUR 2.5 (H) 11/30/2012        Relevant past  medical, surgical, family and social history reviewed and updated as indicated. Interim medical history since our last visit reviewed. Allergies and medications reviewed and updated. Outpatient Medications Prior to Visit  Medication Sig Dispense Refill   amLODipine (NORVASC) 10 MG tablet Take 1 tablet (10 mg total) by mouth daily. 90 tablet 3   D3-50 1.25 MG (50000 UT) capsule TAKE 1 CAPSULE BY MOUTH ONCE A WEEK 12 capsule 0   JARDIANCE 10 MG TABS tablet Take 10 mg by mouth daily.     KERENDIA 10 MG TABS Take 1 tablet by mouth daily.     losartan (COZAAR) 100 MG tablet Take 1 tablet (100 mg total) by mouth daily. 90 tablet 3   sildenafil (VIAGRA) 100 MG tablet TAKE 0.5-1 TABLETS BY MOUTH DAILY AS NEEDED FOR ERECTILE DYSFUNCTION. 5 tablet 6   atorvastatin (LIPITOR) 40 MG tablet Take 1 tablet (40 mg total) by mouth daily. 90 tablet 3   Blood Glucose Monitoring Suppl (ONE TOUCH ULTRA MINI) w/Device KIT Use as directed 1 each 0   carvedilol (COREG) 12.5 MG tablet Take 12.5 mg by mouth 2 (two) times daily.     glimepiride (AMARYL) 4 MG tablet Take 1 tablet (4 mg total) by mouth daily. Always administer with food 90 tablet 3   glucose blood (ONE TOUCH ULTRA TEST) test strip CHECK TWICE DAILY 200 each 3   metFORMIN (GLUCOPHAGE-XR) 750 MG 24 hr tablet Take 2 tablets (1,500 mg total) by mouth daily with  breakfast. 180 tablet 3   OZEMPIC, 0.25 OR 0.5 MG/DOSE, 2 MG/3ML SOPN Inject 0.5 mg into the skin once a week.     carvedilol (COREG) 6.25 MG tablet Take 1 tablet (6.25 mg total) by mouth 2 (two) times daily.     Semaglutide,0.25 or 0.5MG/DOS, (OZEMPIC, 0.25 OR 0.5 MG/DOSE,) 2 MG/1.5ML SOPN Inject 0.5 mg into the skin once a week. 6 mL    No facility-administered medications prior to visit.     Per HPI unless specifically indicated in ROS section below Review of Systems  Objective:  BP (!) 158/88 (BP Location: Right Arm, Cuff Size: Large)   Pulse 89   Temp (!) 97.5 F (36.4 C) (Temporal)   Ht 5'  10.5" (1.791 m)   Wt (!) 307 lb 2 oz (139.3 kg)   SpO2 100%   BMI 43.44 kg/m   Wt Readings from Last 3 Encounters:  02/15/22 (!) 307 lb 2 oz (139.3 kg)  05/18/21 (!) 312 lb 5 oz (141.7 kg)  04/24/21 (!) 304 lb 8 oz (138.1 kg)      Physical Exam Vitals and nursing note reviewed.  Constitutional:      Appearance: Normal appearance. He is not ill-appearing.  HENT:     Mouth/Throat:     Comments: Wearing mask Eyes:     Extraocular Movements: Extraocular movements intact.     Conjunctiva/sclera: Conjunctivae normal.     Pupils: Pupils are equal, round, and reactive to light.  Cardiovascular:     Rate and Rhythm: Normal rate and regular rhythm.     Pulses: Normal pulses.     Heart sounds: Normal heart sounds. No murmur heard. Pulmonary:     Effort: Pulmonary effort is normal. No respiratory distress.     Breath sounds: Normal breath sounds. No wheezing, rhonchi or rales.  Musculoskeletal:     Right lower leg: No edema.     Left lower leg: No edema.     Comments: See HPI for foot exam if done  Skin:    General: Skin is warm and dry.     Findings: No rash.  Neurological:     Mental Status: He is alert.  Psychiatric:        Mood and Affect: Mood normal.        Behavior: Behavior normal.       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 Records requested.       Relevant Medications   JARDIANCE 10 MG TABS tablet   atorvastatin (LIPITOR) 40 MG tablet   metFORMIN (GLUCOPHAGE-XR) 750 MG 24 hr tablet   glimepiride  (AMARYL) 1 MG tablet   Semaglutide, 1 MG/DOSE, 4 MG/3ML SOPN   Hyperlipidemia associated with type 2 diabetes mellitus (HCC)    Atorvastatin refilled for him.       Relevant Medications   JARDIANCE 10 MG TABS tablet   atorvastatin (LIPITOR) 40 MG tablet   metFORMIN (GLUCOPHAGE-XR) 750  MG 24 hr tablet   carvedilol (COREG) 6.25 MG tablet   glimepiride (AMARYL) 1 MG tablet   Semaglutide, 1 MG/DOSE, 4 MG/3ML SOPN   Hypertension    Chronic, uncontrolled. Better control recently at nephrology office. He does not he missed all BP meds yesterday. Advised continue to monitor BP and notify us if persistently >140/90. Continue 4 drug regimen - amlodipine, carvedilol, kerendia, losartan, as well as jardiance 29m daily.      Relevant Medications   atorvastatin (LIPITOR) 40 MG tablet   carvedilol (COREG) 6.25 MG tablet   Type II diabetes mellitus with complication (HCC) - Primary    Great control on current regimen of metformin XR 15025mdaily, amaryl 58m27maily, ozempic 0.5mg7mekly. Notes some hypoglycemia without awareness (cbg recently 63 at nephrologist's office). Will drop amarly dose to 1mg 33mh dinner (he skips breakfast) and increase ozempic dose to 1mg w27mly, watching for epigastric pain, nausea, constipation. Discussed option to taper off amaryl if doing well.       Relevant Medications   JARDIANCE 10 MG TABS tablet   atorvastatin (LIPITOR) 40 MG tablet   metFORMIN (GLUCOPHAGE-XR) 750 MG 24 hr tablet   glimepiride (AMARYL) 1 MG tablet   Semaglutide, 1 MG/DOSE, 4 MG/3ML SOPN   Other Relevant Orders   POCT glycosylated hemoglobin (Hb A1C) (Completed)   CKD stage 3 due to type 2 diabetes mellitus (HCC)  Had[place]reciate nephrology care - now [place] GFR = 30.       Relevant Medications   JARDIANCE 10 MG TABS tablet   atorvastatin (LIPITOR) 40 MG tablet   metFORMIN (GLUCOPHAGE-XR) 750 MG 24 hr tablet   glimepiride (AMARYL) 1 MG tablet   Semaglutide, 1 MG/DOSE,  4 MG/3ML SOPN     Meds ordered this encounter  Medications   atorvastatin (LIPITOR) 40 MG tablet    Sig: Take 1 tablet (40 mg total) by mouth daily.    Dispense:  90 tablet    Refill:  2   DISCONTD: glimepiride (AMARYL) 4 MG tablet    Sig: Take 1 tablet (4 mg total) by mouth daily. Always administer with food    Dispense:  90 tablet    Refill:  2   metFORMIN (GLUCOPHAGE-XR) 750 MG 24 hr tablet    Sig: Take 2 tablets (1,500 mg total) by mouth daily with breakfast.    Dispense:  180 tablet    Refill:  2   Blood Glucose Monitoring Suppl (ONE TOUCH ULTRA MINI) w/Device KIT    Sig: Use as directed    Dispense:  1 kit    Refill:  0   glucose blood (ONE TOUCH ULTRA TEST) test strip    Sig: CHECK TWICE DAILY    Dispense:  200 each    Refill:  3   DISCONTD: OZEMPIC, 0.25 OR 0.5 MG/DOSE, 2 MG/3ML SOPN    Sig: Inject 0.5 mg into the skin once a week.    Dispense:  3 mL    Refill:  9   glimepiride (AMARYL) 1 MG tablet    Sig: Take 1 tablet (1 mg total) by mouth daily. Always administer with food    Dispense:  90 tablet    Refill:  2    Note new dose   Semaglutide, 1 MG/DOSE, 4 MG/3ML SOPN    Sig: Inject 1 mg as directed once a week.    Dispense:  3 mL    Refill:  11  Use this higher dose   Orders Placed This Encounter  Procedures   POCT glycosylated hemoglobin (Hb A1C)    Patient Instructions  Sugars are doing great! Increase ozempic to 77m weekly - let [place] if any trouble tolerating this. Drop amaryl (glimepiride) to 1 mg daily with dinner. If sugars doing well on higher ozempic dose, may even try coming off amary (glimepiride).  Blood pressures are still too high - possibly from missed doses yesterday. Continue monitoring BP at home, let me know if consistently >140/90.  Return in 3-4 months for physical.   Follow up plan: Return in about 3 months (around 05/18/2022) for annual exam, prior fasting for blood work.  JRia Bush MD

## 2022-02-15 NOTE — Assessment & Plan Note (Signed)
Chronic, uncontrolled. Better control recently at nephrology office. He does not he missed all BP meds yesterday. Advised continue to monitor BP and notify us if persistently >140/90. Continue 4 drug regimen - amlodipine, carvedilol, kerendia, losartan, as well as jardiance 10mg  daily.

## 2022-02-15 NOTE — Assessment & Plan Note (Addendum)
Appreciate nephrology care - now on [place]. Last GFR = 30.

## 2022-02-15 NOTE — Assessment & Plan Note (Signed)
He states he last saw retinologist - . Records requested.

## 2022-02-15 NOTE — Assessment & Plan Note (Addendum)
Great control on current regimen of metformin XR 1500mg  daily, amaryl 4mg  daily, ozempic 0.5mg  weekly. Notes some hypoglycemia without awareness (cbg recently 63 at nephrologist's office). Will drop amarly dose to 1mg  with dinner (he skips breakfast) and increase ozempic dose to 1mg  weekly, watching for epigastric pain, nausea, constipation. Discussed option to taper off amaryl if doing well.

## 2022-03-01 ENCOUNTER — Encounter: Payer: Self-pay | Admitting: Family Medicine

## 2022-03-04 ENCOUNTER — Encounter: Payer: Self-pay | Admitting: Family Medicine

## 2022-03-09 ENCOUNTER — Other Ambulatory Visit: Payer: Self-pay | Admitting: Family Medicine

## 2022-04-08 ENCOUNTER — Other Ambulatory Visit: Payer: Self-pay | Admitting: Family Medicine

## 2022-04-08 NOTE — Telephone Encounter (Signed)
ERx 1mg  dose.

## 2022-04-08 NOTE — Telephone Encounter (Signed)
Last refill 10/6/203 Last visit 02/15/2022 Next visit 05/21/2022  Requested a higher dose, and not sure if you increased the medication.

## 2022-05-08 ENCOUNTER — Telehealth: Payer: Self-pay

## 2022-05-08 NOTE — Telephone Encounter (Signed)
[facility] Night - Client Nonclinical Telephone Record  [facility] Client [facility] Night - Client Client [facility] [place] - Night Provider Eustaquio Boyden - MD Contact Type Call Who Is Calling Patient / Member / Family / Caregiver Caller Name Labaron Digirolamo Caller Phone Number [phone] Patient Name Jason Whitney Patient DOB [birth year] Call Type Message Only Information Provided Reason for Call Request for General Office Information Initial Comment Caller needs to get a referral to a foot doctor that is within his network, he refused to speak with an after hour nurse, please call back. Disp. Time Disposition Final User 05/08/2022 8:00:33 AM General Information Provided Yes Donnetta Hutching Call Closed By: Donnetta Hutching Transaction Date/Time: 05/08/2022 7:57:35 AM (ET

## 2022-05-08 NOTE — Telephone Encounter (Signed)
I spoke with pt; pt said stood up yesterday and had problem with foot; yesterday x 2 had pain in foot when bends left big toe or bears weight on foot. Pt said he was thinking about going to UC but prefers to be seen at [facility]. Pt having continuous pain when bearing weight. Also having pain in Big toe on lt foot when moves toe at all. No redness or swelling and has had no injury. Pt  has never  had these symptoms before. Tender to touch. Pain level when bearing weight is 7 - 7 1/2.  Offered pt appt today at 11:30 am. 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 Pt request appt for tomorrow. Pt scheduled appt with Dr Patsy Lager on 05/09/22 at 2:40 with UC & ED precautions and pt voiced understanding. Sending note to Dr Patsy Lager and [place] pool.

## 2022-05-09 ENCOUNTER — Ambulatory Visit (INDEPENDENT_AMBULATORY_CARE_PROVIDER_SITE_OTHER): Payer: 59 | Admitting: Family Medicine

## 2022-05-09 ENCOUNTER — Encounter: Payer: Self-pay | Admitting: Family Medicine

## 2022-05-09 VITALS — BP 140/80 | HR 84 | Temp 99.4°F | Ht 70.5 in | Wt 302.5 lb

## 2022-05-09 DIAGNOSIS — E113391 Type 2 diabetes mellitus with moderate nonproliferative diabetic retinopathy without macular edema, right eye: Secondary | ICD-10-CM | POA: Diagnosis not present

## 2022-05-09 DIAGNOSIS — M79672 Pain in left foot: Secondary | ICD-10-CM | POA: Diagnosis not present

## 2022-05-09 MED ORDER — PREDNISONE 20 MG PO TABS: ORAL_TABLET | ORAL | 0 refills | Status: DC

## 2022-05-09 NOTE — Progress Notes (Signed)
Jason Whitney T. Barry Culverhouse, MD, Jason Whitney at [facility] [place], [address]  Phone: [phone]  FAX: [phone]  Jason Whitney - 46 y.o. male  MRN [id number]  Date of Birth: March 06, 1976  Date: 05/09/2022  PCP: Jason Bush, MD  Referral: Jason Bush, MD  Chief Complaint  Patient presents with   Foot Pain    Started on Tuesday-Left side toward big toe   Subjective:   Jason Whitney is a 46 y.o. very pleasant male patient with Body mass index is 42.79 kg/m. who presents with the following:  Patient presents with left-sided foot and toe pain without any kind of inciting event or injury.  Felt something when he turned medially at the great toe, and he also felt it when he stepped down in the middle of the night.   He is now having pain specifically on the medial aspect of the first digit on the left foot.  This is at the MTP joint, and he is not having any pain specifically in the phalanges.  Lab Results  Component Value Date   HGBA1C 6.3 (A) 02/15/2022     Review of Systems is noted in the HPI, as appropriate  Objective:   BP (!) 140/80   Pulse 84   Temp 99.4 F (37.4 C) (Oral)   Ht 5' 10.5" (1.791 m)   Wt (!) 302 lb 8 oz (137.2 kg)   SpO2 97%   BMI 42.79 kg/m   GEN: No acute distress; alert,appropriate. PULM: Breathing comfortably in no respiratory distress PSYCH: Normally interactive.   Left ankle exam is normal.  Nontender tibia and fibula.  Nontender at the Achilles or plantar fascia.  Nontender at the midfoot.  Navicular, cuneiform, cuboid are all nontender.  Shafts of all metatarsals are nontender.  MTP 2 through 4 and 5 are nontender.  The first MTP joint is tender to palpation medially.  The remainder of the joint is nontender including the lateral aspect of the MTP joint and is not tender dorsally or ventrally with the exception of the most medial component.  There is no redness or  warmth.  Laboratory and Imaging Data:  Assessment and Plan:     ICD-10-CM   1. Acute foot pain, left  M79.672     2. 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  I placed the patient in a first ray post to use in his steel toe working boots.  Made this at a [employer] to provide additional cushion to the area.  Also had to give him a course of some steroids to take to try to calm this down acutely.  Medication Management during today's office visit: Meds ordered this encounter  Medications   predniSONE (DELTASONE) 20 MG tablet    Sig: 2 tabs po daily for 5 days, then 1 tab po daily for 5 days    Dispense:  15 tablet    Refill:  0   There are no discontinued medications.  Orders placed today for conditions managed today: No orders of the defined types were placed in this encounter.   Disposition: No follow-ups on file.  Dragon [employer] One speech-to-text software was used for transcription in this dictation.  Possible transcriptional errors can occur using Editor, commissioning.   Signed,  Jason Whitney. Jason Zuercher, MD  Outpatient Encounter Medications as of 05/09/2022  Medication Sig   amLODipine (NORVASC) 10 MG tablet Take 1 tablet (10 mg total) by mouth daily.   atorvastatin (LIPITOR) 40 MG tablet Take 1 tablet (40 mg total) by mouth daily.   Blood Glucose Monitoring Suppl (ONE TOUCH ULTRA MINI) w/Device KIT Use as directed   carvedilol (COREG) 6.25 MG tablet Take 1 tablet (6.25 mg total) by mouth 2 (two) times daily.   D3-50 1.25 MG (50000 UT) capsule TAKE 1 CAPSULE BY MOUTH ONCE A WEEK   glimepiride (AMARYL) 1 MG tablet Take 1 tablet (1 mg total) by mouth daily. Always administer with food   glucose blood (ONE TOUCH ULTRA TEST) test strip CHECK TWICE DAILY   JARDIANCE 10 MG TABS tablet Take 10 mg by mouth daily.    KERENDIA 10 MG TABS Take 1 tablet by mouth daily.   losartan (COZAAR) 100 MG tablet Take 1 tablet (100 mg total) by mouth daily.   metFORMIN (GLUCOPHAGE-XR) 750 MG 24 hr tablet Take 2 tablets (1,500 mg total) by mouth daily with breakfast.   predniSONE (DELTASONE) 20 MG tablet 2 tabs po daily for 5 days, then 1 tab po daily for 5 days   Semaglutide, 1 MG/DOSE, 4 MG/3ML SOPN Inject 1 mg as directed once a week.   sildenafil (VIAGRA) 100 MG tablet TAKE 0.5-1 TABLETS BY MOUTH DAILY AS NEEDED FOR ERECTILE DYSFUNCTION.   No facility-administered encounter medications on file as of 05/09/2022.

## 2022-05-12 ENCOUNTER — Other Ambulatory Visit: Payer: Self-pay | Admitting: Family Medicine

## 2022-05-12 DIAGNOSIS — I10 Essential (primary) hypertension: Secondary | ICD-10-CM

## 2022-05-24 ENCOUNTER — Other Ambulatory Visit: Payer: 59

## 2022-05-25 ENCOUNTER — Other Ambulatory Visit: Payer: Self-pay | Admitting: Family Medicine

## 2022-05-25 DIAGNOSIS — E118 Type 2 diabetes mellitus with unspecified complications: Secondary | ICD-10-CM

## 2022-05-25 DIAGNOSIS — E559 Vitamin D deficiency, unspecified: Secondary | ICD-10-CM

## 2022-05-25 DIAGNOSIS — E1169 Type 2 diabetes mellitus with other specified complication: Secondary | ICD-10-CM

## 2022-05-25 DIAGNOSIS — N183 Chronic kidney disease, stage 3 unspecified: Secondary | ICD-10-CM

## 2022-05-27 ENCOUNTER — Other Ambulatory Visit: Payer: 59

## 2022-05-31 ENCOUNTER — Other Ambulatory Visit (INDEPENDENT_AMBULATORY_CARE_PROVIDER_SITE_OTHER): Payer: 59

## 2022-05-31 ENCOUNTER — Encounter: Payer: 59 | Admitting: Family Medicine

## 2022-05-31 DIAGNOSIS — E1122 Type 2 diabetes mellitus with diabetic chronic kidney disease: Secondary | ICD-10-CM | POA: Diagnosis not present

## 2022-05-31 DIAGNOSIS — E118 Type 2 diabetes mellitus with unspecified complications: Secondary | ICD-10-CM

## 2022-05-31 DIAGNOSIS — N183 Chronic kidney disease, stage 3 unspecified: Secondary | ICD-10-CM | POA: Diagnosis not present

## 2022-05-31 DIAGNOSIS — E785 Hyperlipidemia, unspecified: Secondary | ICD-10-CM

## 2022-05-31 DIAGNOSIS — E1169 Type 2 diabetes mellitus with other specified complication: Secondary | ICD-10-CM | POA: Diagnosis not present

## 2022-05-31 DIAGNOSIS — E559 Vitamin D deficiency, unspecified: Secondary | ICD-10-CM

## 2022-05-31 LAB — MICROALBUMIN / CREATININE URINE RATIO
Creatinine,U: 250.1 mg/dL
Microalb Creat Ratio: 37.7 mg/g — ABNORMAL HIGH (ref 0.0–30.0)
Microalb, Ur: 94.2 mg/dL — ABNORMAL HIGH (ref 0.0–1.9)

## 2022-05-31 LAB — LIPID PANEL
Cholesterol: 173 mg/dL (ref 0–200)
HDL: 28.2 mg/dL — ABNORMAL LOW (ref >39.00)
NonHDL: 144.55
Total CHOL/HDL Ratio: 6
Triglycerides: 279 mg/dL — ABNORMAL HIGH (ref 0.0–149.0)
VLDL: 55.8 mg/dL — ABNORMAL HIGH (ref 0.0–40.0)

## 2022-05-31 LAB — CBC WITH DIFFERENTIAL/PLATELET
Basophils Absolute: 0.1 10*3/uL (ref 0.0–0.1)
Basophils Relative: 0.7 % (ref 0.0–3.0)
Eosinophils Absolute: 0.2 10*3/uL (ref 0.0–0.7)
Eosinophils Relative: 1.7 % (ref 0.0–5.0)
HCT: 36.9 % — ABNORMAL LOW (ref 39.0–52.0)
Hemoglobin: 12.2 g/dL — ABNORMAL LOW (ref 13.0–17.0)
Lymphocytes Relative: 23.4 % (ref 12.0–46.0)
Lymphs Abs: 3.2 10*3/uL (ref 0.7–4.0)
MCHC: 32.9 g/dL (ref 30.0–36.0)
MCV: 85.4 fl (ref 78.0–100.0)
Monocytes Absolute: 1 10*3/uL (ref 0.1–1.0)
Monocytes Relative: 7.2 % (ref 3.0–12.0)
Neutro Abs: 9.1 10*3/uL — ABNORMAL HIGH (ref 1.4–7.7)
Neutrophils Relative %: 67 % (ref 43.0–77.0)
Platelets: 351 10*3/uL (ref 150.0–400.0)
RBC: 4.33 Mil/uL (ref 4.22–5.81)
RDW: 14.8 % (ref 11.5–15.5)
WBC: 13.5 10*3/uL — ABNORMAL HIGH (ref 4.0–10.5)

## 2022-05-31 LAB — COMPREHENSIVE METABOLIC PANEL
ALT: 11 U/L (ref 0–53)
AST: 12 U/L (ref 0–37)
Albumin: 4.1 g/dL (ref 3.5–5.2)
Alkaline Phosphatase: 91 U/L (ref 39–117)
BUN: 37 mg/dL — ABNORMAL HIGH (ref 6–23)
CO2: 23 mEq/L (ref 19–32)
Calcium: 9.4 mg/dL (ref 8.4–10.5)
Chloride: 106 mEq/L (ref 96–112)
Creatinine, Ser: 3.3 mg/dL — ABNORMAL HIGH (ref 0.40–1.50)
GFR: 21.88 mL/min — ABNORMAL LOW (ref >60.00)
Glucose, Bld: 67 mg/dL — ABNORMAL LOW (ref 70–99)
Potassium: 4.8 mEq/L (ref 3.5–5.1)
Sodium: 140 mEq/L (ref 135–145)
Total Bilirubin: 0.6 mg/dL (ref 0.2–1.2)
Total Protein: 8 g/dL (ref 6.0–8.3)

## 2022-05-31 LAB — LDL CHOLESTEROL, DIRECT: Direct LDL: 108 mg/dL

## 2022-05-31 LAB — HEMOGLOBIN A1C: Hgb A1c MFr Bld: 7 % — ABNORMAL HIGH (ref 4.6–6.5)

## 2022-05-31 LAB — VITAMIN D 25 HYDROXY (VIT D DEFICIENCY, FRACTURES): VITD: 22.26 ng/mL — ABNORMAL LOW (ref 30.00–100.00)

## 2022-06-01 LAB — PARATHYROID HORMONE, INTACT (NO CA): PTH: 145 pg/mL — ABNORMAL HIGH (ref 16–77)

## 2022-06-06 ENCOUNTER — Other Ambulatory Visit: Payer: Self-pay | Admitting: Family Medicine

## 2022-06-06 DIAGNOSIS — I10 Essential (primary) hypertension: Secondary | ICD-10-CM

## 2022-06-07 ENCOUNTER — Ambulatory Visit (INDEPENDENT_AMBULATORY_CARE_PROVIDER_SITE_OTHER): Payer: 59 | Admitting: Family Medicine

## 2022-06-07 ENCOUNTER — Encounter: Payer: Self-pay | Admitting: Family Medicine

## 2022-06-07 VITALS — BP 104/70 | HR 83 | Temp 97.2°F | Ht 70.0 in | Wt 295.5 lb

## 2022-06-07 DIAGNOSIS — E559 Vitamin D deficiency, unspecified: Secondary | ICD-10-CM

## 2022-06-07 DIAGNOSIS — D649 Anemia, unspecified: Secondary | ICD-10-CM

## 2022-06-07 DIAGNOSIS — E1122 Type 2 diabetes mellitus with diabetic chronic kidney disease: Secondary | ICD-10-CM

## 2022-06-07 DIAGNOSIS — E1121 Type 2 diabetes mellitus with diabetic nephropathy: Secondary | ICD-10-CM

## 2022-06-07 DIAGNOSIS — E1169 Type 2 diabetes mellitus with other specified complication: Secondary | ICD-10-CM | POA: Diagnosis not present

## 2022-06-07 DIAGNOSIS — E785 Hyperlipidemia, unspecified: Secondary | ICD-10-CM

## 2022-06-07 DIAGNOSIS — I10 Essential (primary) hypertension: Secondary | ICD-10-CM | POA: Diagnosis not present

## 2022-06-07 DIAGNOSIS — N184 Chronic kidney disease, stage 4 (severe): Secondary | ICD-10-CM | POA: Insufficient documentation

## 2022-06-07 DIAGNOSIS — N2581 Secondary hyperparathyroidism of renal origin: Secondary | ICD-10-CM

## 2022-06-07 DIAGNOSIS — E118 Type 2 diabetes mellitus with unspecified complications: Secondary | ICD-10-CM

## 2022-06-07 MED ORDER — METFORMIN HCL ER 750 MG PO TB24: 750.0000 mg | ORAL_TABLET | Freq: Every day | ORAL | 1 refills | Status: DC

## 2022-06-07 MED ORDER — SEMAGLUTIDE (1 MG/DOSE) 4 MG/3ML ~~LOC~~ SOPN: 1.0000 mg | PEN_INJECTOR | SUBCUTANEOUS | 3 refills | Status: DC

## 2022-06-07 MED ORDER — AMLODIPINE BESYLATE 5 MG PO TABS: 5.0000 mg | ORAL_TABLET | Freq: Every day | ORAL | 1 refills | Status: DC

## 2022-06-07 MED ORDER — D3-50 1.25 MG (50000 UT) PO CAPS: 50000.0000 [IU] | ORAL_CAPSULE | ORAL | 3 refills | Status: DC

## 2022-06-07 NOTE — Assessment & Plan Note (Signed)
Discussed with patient

## 2022-06-07 NOTE — Progress Notes (Signed)
Patient ID: Brookes Craine, male    DOB: 1975-10-04, 47 y.o.   MRN: [id number]  This visit was conducted in person.  BP 104/70 (Cuff Size: Large)   Pulse 83   Temp (!) 97.2 F (36.2 C) (Temporal)   Ht 5\' 10"  (1.778 m)   Wt 295 lb 8 oz (134 kg)   SpO2 98%   BMI 42.40 kg/m    CC: CPE - changed to acute visit due to worsening kidney function  Subjective:   HPI: Eziah Negro is a 47 y.o. male presenting on 06/07/2022 for Annual Exam   Recent prednisone course last month by Dr Lorelei Pont for acute capsular injury of left foot due to trauma.   Known hypertension diabetes and nephropathy established with nephrology Dr Candiss Norse, now on jardiance and [place] 10mg  daily as well as carvedilol, losartan, amlodipine.   Recent weight loss noted.  Most recently kidney function - has f/u with with nephrology 07/05/2022.   DM - does not regularly check sugars - last checked 2 wks ago and it was 96. Compliant with antihyperglycemic regimen which includes: jardiance 10mg  daily, metformin XR 1500mg  daily, ozempic 1mg  weekly. No nausea, diarrhea, constipation, no epigastric pain. Amaryl was stopped due to low A1c to 6.3%. Denies low sugars or hypoglycemic symptoms. Denies paresthesias, blurry vision. Last diabetic eye exam 01/2022. Glucometer brand: one touch ultramini. Last foot exam: 02/2022. DSME: . Lab Results  Component Value Date   HGBA1C 7.0 (H) 05/31/2022   Diabetic Foot Exam - Simple   No data filed    Lab Results  Component Value Date   MICROALBUR 94.2 (H) 05/31/2022         Relevant past medical, surgical, family and social history reviewed and updated as indicated. Interim medical history since our last visit reviewed. Allergies and medications reviewed and updated. Outpatient Medications Prior to Visit  Medication Sig Dispense Refill   atorvastatin (LIPITOR) 40 MG tablet Take 1 tablet (40 mg total) by mouth daily. 90 tablet 2   Blood Glucose Monitoring Suppl (ONE TOUCH ULTRA MINI)  w/Device KIT Use as directed 1 kit 0   carvedilol (COREG) 6.25 MG tablet Take 1 tablet (6.25 mg total) by mouth 2 (two) times daily.     glucose blood (ONE TOUCH ULTRA TEST) test strip CHECK TWICE DAILY 200 each 3   JARDIANCE 10 MG TABS tablet Take 10 mg by mouth daily.     KERENDIA 10 MG TABS Take 1 tablet by mouth daily.     losartan (COZAAR) 100 MG tablet TAKE 1 TABLET BY MOUTH EVERY DAY 90 tablet 0   sildenafil (VIAGRA) 100 MG tablet TAKE 1/2 - 1 TABLET BY MOUTH DAILY AS NEEDED FOR ERECTILE DYSFUNCTION 5 tablet 0   amLODipine (NORVASC) 10 MG tablet TAKE 1 TABLET BY MOUTH EVERY DAY 90 tablet 0   D3-50 1.25 MG (50000 UT) capsule TAKE 1 CAPSULE BY MOUTH ONCE A WEEK 12 capsule 0   metFORMIN (GLUCOPHAGE-XR) 750 MG 24 hr tablet Take 2 tablets (1,500 mg total) by mouth daily with breakfast. 180 tablet 2   Semaglutide, 1 MG/DOSE, 4 MG/3ML SOPN Inject 1 mg as directed once a week. 3 mL 11   glimepiride (AMARYL) 1 MG tablet Take 1 tablet (1 mg total) by mouth daily. Always administer with food 90 tablet 2   predniSONE (DELTASONE) 20 MG tablet 2 tabs po daily for 5 days, then 1 tab po daily for 5 days 15 tablet 0   No facility-administered  medications prior to visit.     Per HPI unless specifically indicated in ROS section below Review of Systems  Objective:  BP 104/70 (Cuff Size: Large)   Pulse 83   Temp (!) 97.2 F (36.2 C) (Temporal)   Ht 5\' 10"  (1.778 m)   Wt 295 lb 8 oz (134 kg)   SpO2 98%   BMI 42.40 kg/m   Wt Readings from Last 3 Encounters:  06/07/22 295 lb 8 oz (134 kg)  05/09/22 (!) 302 lb 8 oz (137.2 kg)  02/15/22 (!) 307 lb 2 oz (139.3 kg)      Physical Exam Vitals and nursing note reviewed.  Constitutional:      Appearance: Normal appearance. He is obese. He is not ill-appearing.  HENT:     Mouth/Throat:     Mouth: Mucous membranes are moist.     Pharynx: Oropharynx is clear. No oropharyngeal exudate or posterior oropharyngeal erythema.  Cardiovascular:     Rate and  Rhythm: Normal rate and regular rhythm.     Pulses: Normal pulses.     Heart sounds: Normal heart sounds. No murmur heard. Pulmonary:     Effort: Pulmonary effort is normal. No respiratory distress.     Breath sounds: Normal breath sounds. No wheezing, rhonchi or rales.  Musculoskeletal:     Right lower leg: No edema.     Left lower leg: No edema.  Skin:    General: Skin is warm and dry.     Findings: No rash.  Neurological:     Mental Status: He is alert.  Psychiatric:        Mood and Affect: Mood normal.        Behavior: Behavior normal.       Results for orders placed or performed in visit on 05/31/22  CBC with Differential/Platelet  Result Value Ref Range   WBC 13.5 (H) 4.0 - 10.5 K/uL   RBC 4.33 4.22 - 5.81 Mil/uL   Hemoglobin 12.2 (L) 13.0 - 17.0 g/dL   HCT 36.9 (L) 39.0 - 52.0 %   MCV 85.4 78.0 - 100.0 fl   MCHC 32.9 30.0 - 36.0 g/dL   RDW 14.8 11.5 - 15.5 %   Platelets 351.0 150.0 - 400.0 K/uL   Neutrophils Relative % 67.0 43.0 - 77.0 %   Lymphocytes Relative 23.4 12.0 - 46.0 %   Monocytes Relative 7.2 3.0 - 12.0 %   Eosinophils Relative 1.7 0.0 - 5.0 %   Basophils Relative 0.7 0.0 - 3.0 %   Neutro Abs 9.1 (H) 1.4 - 7.7 K/uL   Lymphs Abs 3.2 0.7 - 4.0 K/uL   Monocytes Absolute 1.0 0.1 - 1.0 K/uL   Eosinophils Absolute 0.2 0.0 - 0.7 K/uL   Basophils Absolute 0.1 0.0 - 0.1 K/uL  Parathyroid hormone, intact (no Ca)  Result Value Ref Range   PTH 145 (H) 16 - 77 pg/mL  VITAMIN D 25 Hydroxy (Vit-D Deficiency, Fractures)  Result Value Ref Range   VITD 22.26 (L) 30.00 - 100.00 ng/mL  Comprehensive metabolic panel  Result Value Ref Range   Sodium 140 135 - 145 mEq/L   Potassium 4.8 3.5 - 5.1 mEq/L   Chloride 106 96 - 112 mEq/L   CO2 23 19 - 32 mEq/L   Glucose, Bld 67 (L) 70 - 99 mg/dL   BUN 37 (H) 6 - 23 mg/dL   Creatinine, Ser 3.30 (H) 0.40 - 1.50 mg/dL   Total Bilirubin 0.6 0.2 - 1.2 mg/dL  Alkaline Phosphatase 91 39 - 117 U/L   AST 12 0 - 37 U/L   ALT 11  0 - 53 U/L   Total Protein 8.0 6.0 - 8.3 g/dL   Albumin 4.1 3.5 - 5.2 g/dL   GFR 10.62 (L) >69.48 mL/min   Calcium 9.4 8.4 - 10.5 mg/dL  Lipid panel  Result Value Ref Range   Cholesterol 173 0 - 200 mg/dL   Triglycerides 546.2 (H) 0.0 - 149.0 mg/dL   HDL 70.35 (L) >00.93 mg/dL   VLDL 81.8 (H) 0.0 - 29.9 mg/dL   Total CHOL/HDL Ratio 6    NonHDL 144.55   Microalbumin / creatinine urine ratio  Result Value Ref Range   Microalb, Ur 94.2 (H) 0.0 - 1.9 mg/dL   Creatinine,U 371.6 mg/dL   Microalb Creat Ratio 37.7 (H) 0.0 - 30.0 mg/g  Hemoglobin A1c  Result Value Ref Range   Hgb A1c MFr Bld 7.0 (H) 4.6 - 6.5 %  LDL cholesterol, direct  Result Value Ref Range   Direct LDL 108.0 mg/dL    Assessment & Plan:   Problem List Items Addressed This Visit     Hyperlipidemia associated with type 2 diabetes mellitus (HCC)    Chronic, on atorvastatin 40mg . LDL above goal, trig too high. No med changes recommended at this time. The 10-year ASCVD risk score (Arnett DK, et al., 2019) is: 9.3%   Values used to calculate the score:     Age: 47 years     Sex: Male     Is Non-Hispanic African American: Yes     Diabetic: Yes     Tobacco smoker: No     Systolic Blood Pressure: 104 mmHg     Is BP treated: Yes     HDL Cholesterol: 28.2 mg/dL     Total Cholesterol: 173 mg/dL       Relevant Medications   Semaglutide, 1 MG/DOSE, 4 MG/3ML SOPN   metFORMIN (GLUCOPHAGE-XR) 750 MG 24 hr tablet   amLODipine (NORVASC) 5 MG tablet   Severe obesity (BMI >= 40) (HCC)    Congratulated on ongoing weight loss efforts, down 7 lbs in the past month.  Continue ozempic.       Relevant Medications   Semaglutide, 1 MG/DOSE, 4 MG/3ML SOPN   metFORMIN (GLUCOPHAGE-XR) 750 MG 24 hr tablet   Hypertension    Chronic, on amlodipine 10mg , losartan 100mg , carvedilol 6.25mg  bid, kerendia and jardiance.  BP on recheck in office 104/70. Anticipate hypotension contributing to worsening kidney function - will drop  amlodipine to 5mg  dose and I asked pt to monitor BP at home several times a week, call 59 with BP readings in 1-2 wks. .       Relevant Medications   amLODipine (NORVASC) 5 MG tablet   Vitamin D deficiency    Has been off replacement - requests weekly vit D refilled.       Type II diabetes mellitus with complication (HCC) - Primary    Chronic, stable readings over the past several years. Current regimen is amaryl, metformin XR, jardiance and ozempic.  Amaryl stopped last week due to persistently low sugar to 67 on recent labwork.  Will drop metformin XR dose to 750mg  1 tab with lunch given progressive CKD.  RTC 3 mo DM, HTN, CKD f/u visit       Relevant Medications   Semaglutide, 1 MG/DOSE, 4 MG/3ML SOPN   metFORMIN (GLUCOPHAGE-XR) 750 MG 24 hr tablet   CKD stage 4 due to  type 2 diabetes mellitus (HCC)    Progression of CKD noted with GFR down to 22, he sees Dr Candiss Norse renal. Pt states GFR ~35 when last checked by nephrologist. On [place]amp for the past year as well as Jardiance.  Anticipate hypotension contributing - will drop amlodipine to 5mg  daily. Consider holding [place]. Encouraged good hydration status. Rec sooner f/ with renal - appt scheduled for next month.       Relevant Medications   Semaglutide, 1 MG/DOSE, 4 MG/3ML SOPN   metFORMIN (GLUCOPHAGE-XR) 750 MG 24 hr tablet   Secondary hyperparathyroidism of renal origin [facility])    Discussed with patient.       Microalbuminuric diabetic nephropathy (HCC)    Continue losartan, Jardiance, Kerendia      Relevant Medications   Semaglutide, 1 MG/DOSE, 4 MG/3ML SOPN   metFORMIN (GLUCOPHAGE-XR) 750 MG 24 hr tablet   Anemia    Progressive anemia - anticipate related to CKD.  Check iFOB r/o blood loss anemia.       Relevant Orders   Fecal occult blood, imunochemical     Meds ordered this encounter  Medications   Semaglutide, 1 MG/DOSE, 4 MG/3ML SOPN    Sig: Inject 1 mg as directed once a week.    Dispense:  9 mL     Refill:  3   Cholecalciferol (D3-50) 1.25 MG (50000 UT) capsule    Sig: Take 1 capsule (50,000 Units total) by mouth once a week.    Dispense:  12 capsule    Refill:  3   metFORMIN (GLUCOPHAGE-XR) 750 MG 24 hr tablet    Sig: Take 1 tablet (750 mg total) by mouth daily with breakfast.    Dispense:  90 tablet    Refill:  1    Note new sig   amLODipine (NORVASC) 5 MG tablet    Sig: Take 1 tablet (5 mg total) by mouth daily.    Dispense:  90 tablet    Refill:  1    Note new dose    Orders Placed This Encounter  Procedures   Fecal occult blood, imunochemical    Standing Status:   Future    Standing Expiration Date:   06/08/2023    Patient Instructions  Kidneys are worse, diabetes is doing good, blood pressures may be too low now:  Drop metformin XR to just 1 tablet (750mg ) daily  Drop amlodipine to 5mg  daily - new dose sent to pharmacy.  Keep track of blood pressures at home more closely - several times a week and call us with readings in 1-2 weeks.  Restart vitamin D 50,000 units weekly Pass by lab to pick up stool kit.  Return in 3 months for diabetes and high blood pressure follow up.   Follow up plan: Return in about 3 months (around 09/06/2022) for follow up visit.  Ria Bush, MD

## 2022-06-07 NOTE — Assessment & Plan Note (Signed)
Progressive anemia - anticipate related to CKD.  Check iFOB r/o blood loss anemia.

## 2022-06-07 NOTE — Assessment & Plan Note (Signed)
Chronic, on atorvastatin 40mg . LDL above goal, trig too high. No med changes recommended at this time. The 10-year ASCVD risk score (Arnett DK, et al., 2019) is: 9.3%   Values used to calculate the score:     Age: 47 years     Sex: Male     Is Non-Hispanic African American: Yes     Diabetic: Yes     Tobacco smoker: No     Systolic Blood Pressure: 096 mmHg     Is BP treated: Yes     HDL Cholesterol: 28.2 mg/dL     Total Cholesterol: 173 mg/dL

## 2022-06-07 NOTE — Assessment & Plan Note (Signed)
Continue losartan, Doy Mince

## 2022-06-07 NOTE — Assessment & Plan Note (Signed)
Has been off replacement - requests weekly vit D refilled.

## 2022-06-07 NOTE — Assessment & Plan Note (Addendum)
Progression of CKD noted with GFR down to 22, he sees Dr Candiss Norse renal. Pt states GFR ~35 when last checked by nephrologist. On [place] Clamp for the past year as well as Jardiance.  Anticipate hypotension contributing - will drop amlodipine to 5mg  daily. Consider holding [place]. Encouraged good hydration status. Rec sooner f/ with renal - appt scheduled for next month.

## 2022-06-07 NOTE — Assessment & Plan Note (Addendum)
Chronic, stable readings over the past several years. Current regimen is amaryl, metformin XR, jardiance and ozempic.  Amaryl stopped last week due to persistently low sugar to 67 on recent labwork.  Will drop metformin XR dose to 750mg  1 tab with lunch given progressive CKD.  RTC 3 mo DM, HTN, CKD f/u visit

## 2022-06-07 NOTE — Assessment & Plan Note (Signed)
Congratulated on ongoing weight loss efforts, down 7 lbs in the past month.  Continue ozempic.

## 2022-06-07 NOTE — Assessment & Plan Note (Addendum)
Chronic, on amlodipine 10mg , losartan 100mg , carvedilol 6.25mg  bid, kerendia and jardiance.  BP on recheck in office 104/70. Anticipate hypotension contributing to worsening kidney function - will drop amlodipine to 5mg  dose and I asked pt to monitor BP at home several times a week, call us with BP readings in 1-2 wks. Marland Kitchen

## 2022-06-07 NOTE — Patient Instructions (Addendum)
Kidneys are worse, diabetes is doing good, blood pressures may be too low now:  Drop metformin XR to just 1 tablet (750mg ) daily  Drop amlodipine to 5mg  daily - new dose sent to pharmacy.  Keep track of blood pressures at home more closely - several times a week and call us with readings in 1-2 weeks.  Restart vitamin D 50,000 units weekly Pass by lab to pick up stool kit.  Return in 3 months for diabetes and high blood pressure follow up.

## 2022-06-20 ENCOUNTER — Other Ambulatory Visit (INDEPENDENT_AMBULATORY_CARE_PROVIDER_SITE_OTHER): Payer: 59 | Admitting: Radiology

## 2022-06-20 DIAGNOSIS — D649 Anemia, unspecified: Secondary | ICD-10-CM | POA: Diagnosis not present

## 2022-06-21 LAB — FECAL OCCULT BLOOD, IMMUNOCHEMICAL: Fecal Occult Bld: NEGATIVE

## 2022-07-03 ENCOUNTER — Other Ambulatory Visit: Payer: Self-pay | Admitting: Family Medicine

## 2022-07-03 DIAGNOSIS — N529 Male erectile dysfunction, unspecified: Secondary | ICD-10-CM

## 2022-07-05 LAB — BASIC METABOLIC PANEL
BUN: 28 — AB (ref 4–21)
Creatinine: 2.7 — AB (ref 0.6–1.3)
Potassium: 4.2 mEq/L (ref 3.5–5.1)
Sodium: 140 (ref 137–147)

## 2022-07-07 LAB — MICROALBUMIN / CREATININE URINE RATIO
EGFR: 29
Microalb Creat Ratio: 1188
Microalbumin, Urine: 1749.3

## 2022-07-15 ENCOUNTER — Other Ambulatory Visit (HOSPITAL_COMMUNITY): Payer: Self-pay | Admitting: Nephrology

## 2022-07-15 DIAGNOSIS — R809 Proteinuria, unspecified: Secondary | ICD-10-CM

## 2022-07-26 ENCOUNTER — Ambulatory Visit (HOSPITAL_COMMUNITY): Payer: 59

## 2022-07-31 NOTE — Progress Notes (Signed)
Chief Complaint: Patient was seen in consultation today for non-focal renal biopsy   Referring Physician(s): Singh,Vikas  Supervising Physician: Sandi Mariscal  Patient Status: [facility] - Out-pt  History of Present Illness: Jason Whitney is a 47 y.o. male with a medical history significant for DM2, diabetic retinopathy, HTN, severe obesity, OSA and CKD. He is followed by [facility] for management of his chronic kidney disease. Labs obtained by Nephrology show elevated creatinine levels and significant proteinuria. Secondary work up has been unrevealing.  Interventional Radiology has been asked to evaluate this patient for an image-guided non-focal renal biopsy for further work up.   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 No pertinent surgical history.  Allergies: Patient has no known allergies.  Medications: Prior to Admission medications   Medication Sig Start Date End Date Taking? Authorizing Provider  amLODipine (NORVASC) 5 MG tablet Take 1 tablet (5 mg total) by mouth daily. 06/07/22   Ria Bush, MD  atorvastatin (LIPITOR) 40 MG tablet Take 1 tablet (40 mg total) by mouth daily. 02/15/22   Ria Bush, MD  Blood Glucose Monitoring Suppl (ONE TOUCH ULTRA MINI) w/Device KIT Use as directed 02/15/22   Ria Bush, MD  carvedilol (COREG) 6.25 MG tablet Take 1 tablet (6.25 mg total) by mouth 2 (two) times daily. 02/15/22   Ria Bush, MD  Cholecalciferol (D3-50) 1.25 MG (50000 UT) capsule Take 1 capsule (50,000 Units total) by mouth once a week. 06/07/22   Ria Bush, MD  glucose blood (ONE TOUCH ULTRA TEST) test strip CHECK TWICE DAILY 02/15/22   Ria Bush, MD  JARDIANCE 10 MG TABS tablet Take 10 mg by mouth daily. 01/23/22   [provider]  KERENDIA  10 MG TABS Take 1 tablet by mouth daily. 01/24/22   [provider]  losartan (COZAAR) 100 MG tablet TAKE 1 TABLET BY MOUTH EVERY DAY 05/17/22   Ria Bush, MD  metFORMIN (GLUCOPHAGE-XR) 750 MG 24 hr tablet Take 1 tablet (750 mg total) by mouth daily with breakfast. 06/07/22   Ria Bush, MD  Semaglutide, 1 MG/DOSE, 4 MG/3ML SOPN Inject 1 mg as directed once a week. 06/07/22   Ria Bush, MD  sildenafil (VIAGRA) 100 MG tablet TAKE 1/2 - 1 TABLET BY MOUTH DAILY AS NEEDED FOR ERECTILE DYSFUNCTION 07/03/22   Ria Bush, MD     Family History  Problem Relation Age of Onset   Diabetes Mother    Diabetes Maternal Grandmother    Stroke Maternal Grandmother     Social History   Socioeconomic History   Marital status: Single    Spouse name: Not on file   Number of children: Not on file   Years of education: Not on file   Highest education level: Not on file  Occupational History   Occupation: truck driver    Employer: OTHER  Tobacco Use   Smoking status: Never   Smokeless tobacco: Never  Vaping Use   Vaping Use: Never used  Substance and Sexual Activity   Alcohol use: Yes    Comment: Occasional   Drug use: No   Sexual activity: Not on file  Other Topics Concern   Not on file  Social History Narrative   Caffeine: little   Lives with girlfriend and daughter (2006); no pets  Edu: 12th grade education    Occ: truck driver   Activity: no regular exercise    Diet: good water, fruits/vegetables daily    Social Determinants of Health   Financial Resource Strain: Not on file  Food Insecurity: Not on file  Transportation Needs: Not on file  Physical Activity: Not on file  Stress: Not on file  Social Connections: Not on file    Review of Systems: A 12 point ROS discussed and pertinent positives are indicated in the HPI above.  All other systems are negative.  Review of Systems  Constitutional:  Negative for appetite change and fatigue.   Respiratory:  Negative for cough and shortness of breath.   Cardiovascular:  Negative for chest pain and leg swelling.  Gastrointestinal:  Negative for abdominal pain, diarrhea, nausea and vomiting.  Genitourinary:  Negative for difficulty urinating and flank pain.  Neurological:  Negative for dizziness and headaches.    Vital Signs: BP 130/79   Pulse 93   Temp 98.4 F (36.9 C) (Oral)   Ht 5\' 11"  (1.803 m)   Wt 298 lb (135.2 kg)   SpO2 99%   BMI 41.56 kg/m   Physical Exam Constitutional:      General: He is not in acute distress.    Appearance: He is obese.  HENT:     Mouth/Throat:     Mouth: Mucous membranes are moist.     Pharynx: Oropharynx is clear.  Cardiovascular:     Rate and Rhythm: Normal rate and regular rhythm.     Pulses: Normal pulses.     Heart sounds: Normal heart sounds.  Pulmonary:     Effort: Pulmonary effort is normal.     Breath sounds: Normal breath sounds.  Abdominal:     General: Bowel sounds are normal.     Palpations: Abdomen is soft.     Tenderness: There is no abdominal tenderness.  Musculoskeletal:     Right lower leg: No edema.     Left lower leg: No edema.  Skin:    General: Skin is warm and dry.  Neurological:     Mental Status: He is alert and oriented to person, place, and time.  Psychiatric:        Mood and Affect: Mood normal.        Behavior: Behavior normal.        Thought Content: Thought content normal.        Judgment: Judgment normal.     Imaging: No results found.  Labs:  CBC: Recent Labs    05/31/22 0844 08/02/22 0600  WBC 13.5* 11.1*  HGB 12.2* 11.7*  HCT 36.9* 35.1*  PLT 351.0 336    COAGS: Recent Labs    08/02/22 0600  INR 1.2    BMP: Recent Labs    05/31/22 0844  NA 140  K 4.8  CL 106  CO2 23  GLUCOSE 67*  BUN 37*  CALCIUM 9.4  CREATININE 3.30*    LIVER FUNCTION TESTS: Recent Labs    05/31/22 0844  BILITOT 0.6  AST 12  ALT 11  ALKPHOS 91  PROT 8.0  ALBUMIN 4.1    TUMOR  MARKERS: No results for input(s): "AFPTM", "CEA", "CA199", "CHROMGRNA" in the last 8760 hours.  Assessment and Plan:  Chronic kidney disease; proteinuria: Jason Whitney, 47 year old male, presents today to the [facility] department for an image-guided non-focal renal biopsy.   Risks and benefits of this procedure were discussed with the patient and/or  patient's family including, but not limited to bleeding, infection, damage to adjacent structures or low yield requiring additional tests.  All of the questions were answered and there is agreement to proceed. He has been NPO. He does not take any blood-thinning medications. He is a full code.   Consent signed and in chart.  Thank you for this interesting consult.  I greatly enjoyed meeting Jason Whitney and look forward to participating in their care.  A copy of this report was sent to the requesting provider on this date.  Electronically Signed: Soyla Dryer, AGACNP-BC [phone] 08/02/2022, 7:22 AM   I spent a total of  30 Minutes   in face to face in clinical consultation, greater than 50% of which was counseling/coordinating care for non-focal renal biopsy

## 2022-08-01 ENCOUNTER — Other Ambulatory Visit: Payer: Self-pay | Admitting: Internal Medicine

## 2022-08-01 ENCOUNTER — Other Ambulatory Visit: Payer: Self-pay | Admitting: Student

## 2022-08-01 DIAGNOSIS — N184 Chronic kidney disease, stage 4 (severe): Secondary | ICD-10-CM

## 2022-08-01 DIAGNOSIS — E1122 Type 2 diabetes mellitus with diabetic chronic kidney disease: Secondary | ICD-10-CM

## 2022-08-02 ENCOUNTER — Ambulatory Visit (HOSPITAL_COMMUNITY)
Admission: RE | Admit: 2022-08-02 | Discharge: 2022-08-02 | Disposition: A | Discharge status: Home / Self Care | Payer: 59 | Source: Ambulatory Visit | Attending: Nephrology | Admitting: Nephrology

## 2022-08-02 ENCOUNTER — Other Ambulatory Visit (HOSPITAL_COMMUNITY): Payer: Self-pay | Admitting: Nephrology

## 2022-08-02 ENCOUNTER — Encounter (HOSPITAL_COMMUNITY): Payer: Self-pay

## 2022-08-02 DIAGNOSIS — E1122 Type 2 diabetes mellitus with diabetic chronic kidney disease: Secondary | ICD-10-CM | POA: Diagnosis present

## 2022-08-02 DIAGNOSIS — R809 Proteinuria, unspecified: Secondary | ICD-10-CM | POA: Diagnosis present

## 2022-08-02 DIAGNOSIS — N184 Chronic kidney disease, stage 4 (severe): Secondary | ICD-10-CM | POA: Diagnosis present

## 2022-08-02 LAB — CBC WITH DIFFERENTIAL/PLATELET
Abs Immature Granulocytes: 0.05 10*3/uL (ref 0.00–0.07)
Basophils Absolute: 0.1 10*3/uL (ref 0.0–0.1)
Basophils Relative: 1 %
Eosinophils Absolute: 0.2 10*3/uL (ref 0.0–0.5)
Eosinophils Relative: 2 %
HCT: 35.1 % — ABNORMAL LOW (ref 39.0–52.0)
Hemoglobin: 11.7 g/dL — ABNORMAL LOW (ref 13.0–17.0)
Immature Granulocytes: 0 %
Lymphocytes Relative: 25 %
Lymphs Abs: 2.8 10*3/uL (ref 0.7–4.0)
MCH: 28.7 pg (ref 26.0–34.0)
MCHC: 33.3 g/dL (ref 30.0–36.0)
MCV: 86 fL (ref 80.0–100.0)
Monocytes Absolute: 0.8 10*3/uL (ref 0.1–1.0)
Monocytes Relative: 7 %
Neutro Abs: 7.2 10*3/uL (ref 1.7–7.7)
Neutrophils Relative %: 65 %
Platelets: 336 10*3/uL (ref 150–400)
RBC: 4.08 MIL/uL — ABNORMAL LOW (ref 4.22–5.81)
RDW: 13.7 % (ref 11.5–15.5)
WBC: 11.1 10*3/uL — ABNORMAL HIGH (ref 4.0–10.5)
nRBC: 0 % (ref 0.0–0.2)

## 2022-08-02 LAB — PROTIME-INR
INR: 1.2 (ref 0.8–1.2)
Prothrombin Time: 15.2 seconds (ref 11.4–15.2)

## 2022-08-02 LAB — GLUCOSE, CAPILLARY
Glucose-Capillary: 136 mg/dL — ABNORMAL HIGH (ref 70–99)
Glucose-Capillary: 161 mg/dL — ABNORMAL HIGH (ref 70–99)

## 2022-08-02 MED ORDER — FENTANYL CITRATE (PF) 100 MCG/2ML IJ SOLN
INTRAMUSCULAR | Status: AC
  Filled 2022-08-02: qty 2

## 2022-08-02 MED ORDER — MIDAZOLAM HCL 2 MG/2ML IJ SOLN
INTRAMUSCULAR | Status: AC | PRN
  Administered 2022-08-02: 1 mg via INTRAVENOUS
  Administered 2022-08-02: .5 mg via INTRAVENOUS

## 2022-08-02 MED ORDER — FENTANYL CITRATE (PF) 100 MCG/2ML IJ SOLN
INTRAMUSCULAR | Status: AC | PRN
  Administered 2022-08-02: 50 ug via INTRAVENOUS
  Administered 2022-08-02: 25 ug via INTRAVENOUS

## 2022-08-02 MED ORDER — MIDAZOLAM HCL 2 MG/2ML IJ SOLN
INTRAMUSCULAR | Status: AC
  Filled 2022-08-02: qty 2

## 2022-08-02 MED ORDER — SODIUM CHLORIDE 0.9 % IV SOLN: INTRAVENOUS | Status: DC

## 2022-08-02 NOTE — Procedures (Signed)
Pre Procedure Dx: [name] Procedural Dx: Same  Technically successful CT and US guided biopsy of inferior pole of the left kidney.  EBL: Trace No immediate complications.   Ronny Bacon, MD Pager #: [phone]

## 2022-08-06 ENCOUNTER — Encounter: Payer: Self-pay | Admitting: Family Medicine

## 2022-08-07 ENCOUNTER — Encounter (HOSPITAL_COMMUNITY): Payer: Self-pay

## 2022-08-07 LAB — SURGICAL PATHOLOGY

## 2022-08-24 ENCOUNTER — Other Ambulatory Visit: Payer: Self-pay | Admitting: Family Medicine

## 2022-08-24 DIAGNOSIS — N529 Male erectile dysfunction, unspecified: Secondary | ICD-10-CM

## 2022-08-26 NOTE — Telephone Encounter (Signed)
Refill request Sildenafil Last refill 06/07/22 Last office visit 07/03/22 #5/1

## 2022-09-02 ENCOUNTER — Ambulatory Visit: Payer: 59 | Admitting: Family Medicine

## 2022-09-02 ENCOUNTER — Telehealth: Payer: Self-pay

## 2022-09-02 DIAGNOSIS — E118 Type 2 diabetes mellitus with unspecified complications: Secondary | ICD-10-CM

## 2022-09-02 NOTE — Telephone Encounter (Signed)
[facility] Night - Client Nonclinical Telephone Record  [facility] Client [facility] Night - Client Client Site [facility] - Night Contact Type Call Who Is Calling Patient / Member / Family / Caregiver Caller Name Dimetrius Montfort Caller Phone Number [phone] Patient Name Jason Whitney Patient DOB 12/06/1975 Call Type Message Only Information Provided Reason for Call Request to Reschedule Office Appointment Initial Comment Caller states he has an appt tomorrow, would like to reschedule. Patient request to speak to RN No Additional Comment Appt 4/22 at 1030. Disp. Time Disposition Final User 09/01/2022 1:18:40 PM General Information Provided Yes Dennison Mascot Call Closed By: Dennison Mascot Transaction Date/Time: 09/01/2022 1:16:39 PM (ET   Sending to [facility] support

## 2022-09-14 LAB — LAB REPORT - SCANNED
Albumin, Urine POC: 1428.9
Albumin/Creatinine Ratio, Urine, POC: 1138
EGFR: 24

## 2022-09-16 ENCOUNTER — Encounter: Payer: Self-pay | Admitting: Family Medicine

## 2022-09-16 ENCOUNTER — Ambulatory Visit (INDEPENDENT_AMBULATORY_CARE_PROVIDER_SITE_OTHER): Payer: Self-pay | Admitting: Family Medicine

## 2022-09-16 VITALS — BP 136/84 | HR 95 | Temp 97.2°F | Ht 71.0 in | Wt 281.1 lb

## 2022-09-16 DIAGNOSIS — Z7984 Long term (current) use of oral hypoglycemic drugs: Secondary | ICD-10-CM

## 2022-09-16 DIAGNOSIS — E113313 Type 2 diabetes mellitus with moderate nonproliferative diabetic retinopathy with macular edema, bilateral: Secondary | ICD-10-CM

## 2022-09-16 DIAGNOSIS — N184 Chronic kidney disease, stage 4 (severe): Secondary | ICD-10-CM

## 2022-09-16 DIAGNOSIS — E118 Type 2 diabetes mellitus with unspecified complications: Secondary | ICD-10-CM

## 2022-09-16 DIAGNOSIS — E1122 Type 2 diabetes mellitus with diabetic chronic kidney disease: Secondary | ICD-10-CM

## 2022-09-16 DIAGNOSIS — I10 Essential (primary) hypertension: Secondary | ICD-10-CM

## 2022-09-16 LAB — POCT GLYCOSYLATED HEMOGLOBIN (HGB A1C): Hemoglobin A1C: 6.3 % — AB (ref 4.0–5.6)

## 2022-09-16 NOTE — Patient Instructions (Addendum)
You are doing well today - congratulations on weight loss!  Continue current medicines.  Return in 3 months for physical.

## 2022-09-16 NOTE — Assessment & Plan Note (Signed)
He continues seeing retinologist monthly with eye injections.

## 2022-09-16 NOTE — Assessment & Plan Note (Signed)
Chronic, great control on current regimen - continue lower dose metformin XL 750mg  once daily, jardiance 10mg , ozempic 1mg  weekly.

## 2022-09-16 NOTE — Assessment & Plan Note (Addendum)
Followed by nephrology Dr Thedore Mins. Appreciate renal care.  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

## 2022-09-16 NOTE — Assessment & Plan Note (Signed)
Another 14 lb weight loss noted today. Total 31 lbs since starting GLP1RA Ozempic 2023. Congratulated! Continue ozempic 1mg  weekly.

## 2022-09-16 NOTE — Assessment & Plan Note (Signed)
Chronic, adequate on current regimen - continue this.  Doing better with lower dose amlodipine.

## 2022-09-16 NOTE — Progress Notes (Signed)
Ph: [phone]       Fax: [phone]   Patient ID: Jason Whitney, male    DOB: 05/05/1976, 47 y.o.   MRN: [id number]  This visit was conducted in person.  BP 136/84   Pulse 95   Temp (!) 97.2 F (36.2 C) (Temporal)   Ht 5\' 11"  (1.803 m)   Wt 281 lb 2 oz (127.5 kg)   SpO2 98%   BMI 39.21 kg/m    CC: 3 mo DM f/u visit  Subjective:   HPI: Jason Whitney is a 47 y.o. male presenting on 09/16/2022 for Medical Management of Chronic Issues (Here for 3 mo DM/HTN f/u.)   Another 14 lb weight loss since last seen 05/2022.   Known hypertension diabetes and nephropathy established with nephrology Dr Thedore Mins last seen Friday 09/13/2022 with labs - will await labs. He had kidney biopsy 08/07/2022 - showing nodular diabetic glomerulosclerosis, class IV, without evidence of immune complex-mediated renal disease. Continues amlodipine 5mg  daily, jardiance, carvedilol 6.25mg  bid, losartan 100mg  daily and finerenone 10mg  daily.   Home BP readings occasionally elevated, mostly well controlled 130-135/80s.   DM - does not regularly check sugars. Compliant with antihyperglycemic regimen which includes: jardiance 10mg  daily, metformin XR 750mg  daily, ozempic 1mg  weekly. Tolerating well without constipation, nausea, diarrhea, epigastric abd pain. Denies low sugars or hypoglycemic symptoms. Denies paresthesias, blurry vision. Last diabetic eye exam 01/2022. He is seeing retina specialist for retinal edema, seeing monthly with eye shots. Glucometer brand: one-touch ultramini. Last foot exam: 02/2022. DSME: declines for now. Lab Results  Component Value Date   HGBA1C 6.3 (A) 09/16/2022   Diabetic Foot Exam - Simple   No data filed    Lab Results  Component Value Date   MICROALBUR 94.2 (H) 05/31/2022         Relevant past medical, surgical, family and social history reviewed and updated as indicated. Interim medical history since our last visit reviewed. Allergies and medications reviewed and  updated. Outpatient Medications Prior to Visit  Medication Sig Dispense Refill   amLODipine (NORVASC) 5 MG tablet Take 1 tablet (5 mg total) by mouth daily. 90 tablet 1   atorvastatin (LIPITOR) 40 MG tablet Take 1 tablet (40 mg total) by mouth daily. 90 tablet 2   Blood Glucose Monitoring Suppl (ONE TOUCH ULTRA MINI) w/Device KIT Use as directed 1 kit 0   carvedilol (COREG) 6.25 MG tablet Take 1 tablet (6.25 mg total) by mouth 2 (two) times daily.     Cholecalciferol (D3-50) 1.25 MG (50000 UT) capsule Take 1 capsule (50,000 Units total) by mouth once a week. 12 capsule 3   glucose blood (ONE TOUCH ULTRA TEST) test strip CHECK TWICE DAILY 200 each 3   JARDIANCE 10 MG TABS tablet Take 10 mg by mouth daily.     KERENDIA 10 MG TABS Take 1 tablet by mouth daily.     losartan (COZAAR) 100 MG tablet TAKE 1 TABLET BY MOUTH EVERY DAY 90 tablet 0   metFORMIN (GLUCOPHAGE-XR) 750 MG 24 hr tablet Take 1 tablet (750 mg total) by mouth daily with breakfast. 90 tablet 1   Semaglutide, 1 MG/DOSE, 4 MG/3ML SOPN Inject 1 mg as directed once a week. 9 mL 3   sildenafil (VIAGRA) 100 MG tablet TAKE 1/2 - 1 TABLET BY MOUTH DAILY AS NEEDED FOR ERECTILE DYSFUNCTION 5 tablet 1   No facility-administered medications prior to visit.     Per HPI unless specifically indicated in ROS section below Review  of Systems  Objective:  BP 136/84   Pulse 95   Temp (!) 97.2 F (36.2 C) (Temporal)   Ht 5\' 11"  (1.803 m)   Wt 281 lb 2 oz (127.5 kg)   SpO2 98%   BMI 39.21 kg/m   Wt Readings from Last 3 Encounters:  09/16/22 281 lb 2 oz (127.5 kg)  08/02/22 298 lb (135.2 kg)  06/07/22 295 lb 8 oz (134 kg)      Physical Exam Vitals and nursing note reviewed.  Constitutional:      Appearance: Normal appearance. He is not ill-appearing.  HENT:     Head: Normocephalic and atraumatic.     Mouth/Throat:     Mouth: Mucous membranes are moist.     Pharynx: Oropharynx is clear. No oropharyngeal exudate or posterior  oropharyngeal erythema.  Eyes:     Extraocular Movements: Extraocular movements intact.     Pupils: Pupils are equal, round, and reactive to light.  Cardiovascular:     Rate and Rhythm: Normal rate and regular rhythm.     Pulses: Normal pulses.     Heart sounds: Normal heart sounds. No murmur heard. Pulmonary:     Effort: Pulmonary effort is normal. No respiratory distress.     Breath sounds: Normal breath sounds. No wheezing, rhonchi or rales.  Musculoskeletal:     Right lower leg: No edema.     Left lower leg: No edema.  Skin:    General: Skin is warm and dry.     Findings: No rash.  Neurological:     Mental Status: He is alert.  Psychiatric:        Mood and Affect: Mood normal.        Behavior: Behavior normal.       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       Severe obesity (BMI >= 40) (HCC)    Another 14 lb weight loss noted today. Total 31 lbs since starting GLP1RA Ozempic 2023. Congratulated! Continue ozempic 1mg  weekly.       Hypertension    Chronic, adequate on current regimen - continue this.  Doing better with lower dose amlodipine.       Type II diabetes mellitus with complication (HCC) - Primary    Chronic, great control on current regimen - continue lower dose metformin XL 750mg  once daily, jardiance 10mg , ozempic 1mg  weekly.       Relevant Orders   POCT glycosylated hemoglobin (Hb A1C) (Completed)   CKD stage 4 due to type 2 diabetes mellitus (HCC)    Followed by nephrology  Dr Thedore Mins. Appreciate renal care.  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         No orders of the defined types were placed in this encounter.   Orders Placed This Encounter  Procedures   POCT glycosylated hemoglobin (Hb A1C)    Patient Instructions  You are doing well today - congratulations on weight loss!  Continue current medicines.  Return in 3 months for physical.   Follow up plan: Return in about 3 months (around 12/17/2022) for annual exam, prior fasting for blood work.  Eustaquio Boyden, MD

## 2022-10-06 ENCOUNTER — Other Ambulatory Visit: Payer: Self-pay | Admitting: Family Medicine

## 2022-10-06 DIAGNOSIS — N529 Male erectile dysfunction, unspecified: Secondary | ICD-10-CM

## 2022-10-10 ENCOUNTER — Other Ambulatory Visit: Payer: Self-pay | Admitting: Family Medicine

## 2022-10-10 NOTE — Telephone Encounter (Signed)
Change in therapy. Pt takes losartan 100 mg daily. (See 09/16/22 OV notes.) Rx sent 05/17/22, #90/0 to [facility].   Request denied.

## 2022-10-28 ENCOUNTER — Ambulatory Visit: Payer: 59 | Admitting: Family Medicine

## 2022-10-28 ENCOUNTER — Encounter: Payer: Self-pay | Admitting: Family Medicine

## 2022-10-28 VITALS — BP 120/88 | HR 94 | Temp 98.1°F | Ht 71.0 in | Wt 282.0 lb

## 2022-10-28 DIAGNOSIS — S46912A Strain of unspecified muscle, fascia and tendon at shoulder and upper arm level, left arm, initial encounter: Secondary | ICD-10-CM | POA: Diagnosis not present

## 2022-10-28 MED ORDER — PREDNISONE 20 MG PO TABS: ORAL_TABLET | ORAL | 0 refills | Status: DC

## 2022-10-28 NOTE — Progress Notes (Signed)
Jason Whitney T. Jason Monaco, MD, CAQ Sports Medicine [facility] at [facility] [address] [place], [address]  Phone: [phone]  FAX: [phone]  Jason Whitney - 47 y.o. male  MRN [id number]  Date of Birth: [birth year]  Date: 10/28/2022  PCP: Eustaquio Boyden, MD  Referral: Eustaquio Boyden, MD  Chief Complaint  Patient presents with   Arm Pain    Left    Subjective:   Jason Whitney is a 47 y.o. very pleasant male patient with Body mass index is 39.33 kg/m. who presents with the following:  Around last Wed, was trying to close a trailer door.  At that time, he tried to close his trailer with his arm and he sustained some acute pain.  As the days was going, has been hurting more and more he is having pain predominantly in and about the elbow both medially and laterally.  He has had some restriction in his total extension of the elbow.  Flexion is mildly impaired, but it is generally preserved compared to extension..   Hurting all around the L elbow.  Driving a truck for a living, loading truck, etc.  He is right-hand dominant, but he does use his left elbow to drive and lift things for work.  He denies any numbness or tingling.  No neck pain.  No shoulder blade pain.  At home, has tried some tylenol.  Wife had some rubbing cream that helped a little.   Lab Results  Component Value Date   HGBA1C 6.3 (A) 09/16/2022    Using arm all of the time for work and a vocational activities.  Review of Systems is noted in the HPI, as appropriate  Objective:   BP 120/88 (BP Location: Right Arm, Patient Position: Sitting, Cuff Size: Large)   Pulse 94   Temp 98.1 F (36.7 C) (Temporal)   Ht 5\' 11"  (1.803 m)   Wt 282 lb (127.9 kg)   SpO2 97%   BMI 39.33 kg/m   GEN: No acute distress; alert,appropriate. PULM: Breathing comfortably in no respiratory distress PSYCH: Normally interactive.   CERVICAL SPINE EXAM Range of motion: Flexion, extension, lateral  bending, and rotation: Full range of motion in all directions Pain with terminal motion: Minimal to mild pain with terminal motion Spinous Processes: NT SCM: NT Upper paracervical muscles: Minimally tender to palpation Upper traps: NT C5-T1 intact, sensation and motor   At the left elbow he lacks 20 degrees of extension.  I am able to get him slightly more than this with active range of motion assisted by myself, does cause pain, so I stopped him roughly 15 degrees. Flexion is more preserved, though there is terminal endpoint pain.  He does have pain at the medial and lateral epicondyles as well as throughout the more global anterior and posterior muscles of the forearm. No significant tenderness at the triceps or biceps.  Shoulder has full range of motion and strength.  Laboratory and Imaging Data:  Assessment and Plan:     ICD-10-CM   1. Elbow strain, left, initial encounter  O96.295M      Multipart elbow strain, I believe you strained both the extensors and flexors of the forearm.  I am and have him begin doing some basic range of motion, and I will keep him out of work until Wednesday.  With a pulse and was some steroids to try to aggressively get him pain-free and increase his motion.  If symptoms persist we can always do something  additional such as formal physical therapy, however I anticipate this will improve in the next 1 to 2 weeks.  Medication Management during today's office visit: Meds ordered this encounter  Medications   predniSONE (DELTASONE) 20 MG tablet    Sig: 2 tabs po daily for 5 days, then 1 tab po daily for 5 days    Dispense:  15 tablet    Refill:  0   There are no discontinued medications.  Orders placed today for conditions managed today: No orders of the defined types were placed in this encounter.   Disposition: No follow-ups on file.  [employer] One speech-to-text software was used for transcription in this dictation.  Possible  transcriptional errors can occur using Animal nutritionist.   Signed,  Elpidio Galea. Arial Galligan, MD   Outpatient Encounter Medications as of 10/28/2022  Medication Sig   amLODipine (NORVASC) 5 MG tablet Take 1 tablet (5 mg total) by mouth daily.   atorvastatin (LIPITOR) 40 MG tablet Take 1 tablet (40 mg total) by mouth daily.   Blood Glucose Monitoring Suppl (ONE TOUCH ULTRA MINI) w/Device KIT Use as directed   carvedilol (COREG) 6.25 MG tablet Take 1 tablet (6.25 mg total) by mouth 2 (two) times daily.   Cholecalciferol (D3-50) 1.25 MG (50000 UT) capsule Take 1 capsule (50,000 Units total) by mouth once a week.   glucose blood (ONE TOUCH ULTRA TEST) test strip CHECK TWICE DAILY   JARDIANCE 10 MG TABS tablet Take 10 mg by mouth daily.   KERENDIA 10 MG TABS Take 1 tablet by mouth daily.   losartan (COZAAR) 100 MG tablet TAKE 1 TABLET BY MOUTH EVERY DAY   metFORMIN (GLUCOPHAGE-XR) 750 MG 24 hr tablet Take 1 tablet (750 mg total) by mouth daily with breakfast.   predniSONE (DELTASONE) 20 MG tablet 2 tabs po daily for 5 days, then 1 tab po daily for 5 days   Semaglutide, 1 MG/DOSE, 4 MG/3ML SOPN Inject 1 mg as directed once a week.   sildenafil (VIAGRA) 100 MG tablet TAKE 1/2 - 1 TABLET BY MOUTH DAILY AS NEEDED FOR ERECTILE DYSFUNCTION   No facility-administered encounter medications on file as of 10/28/2022.

## 2022-11-29 ENCOUNTER — Other Ambulatory Visit: Payer: Self-pay | Admitting: Family Medicine

## 2022-11-29 DIAGNOSIS — N529 Male erectile dysfunction, unspecified: Secondary | ICD-10-CM

## 2022-12-05 ENCOUNTER — Other Ambulatory Visit: Payer: Self-pay | Admitting: Family Medicine

## 2022-12-05 DIAGNOSIS — E1169 Type 2 diabetes mellitus with other specified complication: Secondary | ICD-10-CM

## 2022-12-05 DIAGNOSIS — E118 Type 2 diabetes mellitus with unspecified complications: Secondary | ICD-10-CM

## 2022-12-05 DIAGNOSIS — E1122 Type 2 diabetes mellitus with diabetic chronic kidney disease: Secondary | ICD-10-CM

## 2022-12-05 DIAGNOSIS — E559 Vitamin D deficiency, unspecified: Secondary | ICD-10-CM

## 2022-12-09 ENCOUNTER — Other Ambulatory Visit (INDEPENDENT_AMBULATORY_CARE_PROVIDER_SITE_OTHER): Payer: 59

## 2022-12-09 DIAGNOSIS — E559 Vitamin D deficiency, unspecified: Secondary | ICD-10-CM | POA: Diagnosis not present

## 2022-12-09 DIAGNOSIS — E1169 Type 2 diabetes mellitus with other specified complication: Secondary | ICD-10-CM

## 2022-12-09 DIAGNOSIS — E118 Type 2 diabetes mellitus with unspecified complications: Secondary | ICD-10-CM

## 2022-12-09 DIAGNOSIS — N184 Chronic kidney disease, stage 4 (severe): Secondary | ICD-10-CM | POA: Diagnosis not present

## 2022-12-09 DIAGNOSIS — E785 Hyperlipidemia, unspecified: Secondary | ICD-10-CM | POA: Diagnosis not present

## 2022-12-09 DIAGNOSIS — E1122 Type 2 diabetes mellitus with diabetic chronic kidney disease: Secondary | ICD-10-CM | POA: Diagnosis not present

## 2022-12-09 LAB — CBC WITH DIFFERENTIAL/PLATELET
Basophils Absolute: 0.1 10*3/uL (ref 0.0–0.1)
Basophils Relative: 0.9 % (ref 0.0–3.0)
Eosinophils Absolute: 0.1 10*3/uL (ref 0.0–0.7)
Eosinophils Relative: 1.3 % (ref 0.0–5.0)
HCT: 35.9 % — ABNORMAL LOW (ref 39.0–52.0)
Hemoglobin: 11.7 g/dL — ABNORMAL LOW (ref 13.0–17.0)
Lymphocytes Relative: 25.1 % (ref 12.0–46.0)
Lymphs Abs: 2.6 10*3/uL (ref 0.7–4.0)
MCHC: 32.5 g/dL (ref 30.0–36.0)
MCV: 87.1 fl (ref 78.0–100.0)
Monocytes Absolute: 0.6 10*3/uL (ref 0.1–1.0)
Monocytes Relative: 5.8 % (ref 3.0–12.0)
Neutro Abs: 7 10*3/uL (ref 1.4–7.7)
Neutrophils Relative %: 66.9 % (ref 43.0–77.0)
Platelets: 316 10*3/uL (ref 150.0–400.0)
RBC: 4.12 Mil/uL — ABNORMAL LOW (ref 4.22–5.81)
RDW: 14.5 % (ref 11.5–15.5)
WBC: 10.5 10*3/uL (ref 4.0–10.5)

## 2022-12-09 LAB — MICROALBUMIN / CREATININE URINE RATIO
Creatinine,U: 111.7 mg/dL
Microalb Creat Ratio: 161.2 mg/g — ABNORMAL HIGH (ref 0.0–30.0)
Microalb, Ur: 180 mg/dL — ABNORMAL HIGH (ref 0.0–1.9)

## 2022-12-09 LAB — LIPID PANEL
Cholesterol: 210 mg/dL — ABNORMAL HIGH (ref 0–200)
HDL: 32.4 mg/dL — ABNORMAL LOW (ref >39.00)
LDL Cholesterol: 143 mg/dL — ABNORMAL HIGH (ref 0–99)
NonHDL: 177.42
Total CHOL/HDL Ratio: 6
Triglycerides: 174 mg/dL — ABNORMAL HIGH (ref 0.0–149.0)
VLDL: 34.8 mg/dL (ref 0.0–40.0)

## 2022-12-09 LAB — COMPREHENSIVE METABOLIC PANEL
ALT: 8 U/L (ref 0–53)
AST: 9 U/L (ref 0–37)
Albumin: 3.7 g/dL (ref 3.5–5.2)
Alkaline Phosphatase: 78 U/L (ref 39–117)
BUN: 33 mg/dL — ABNORMAL HIGH (ref 6–23)
CO2: 24 mEq/L (ref 19–32)
Calcium: 8.9 mg/dL (ref 8.4–10.5)
Chloride: 108 mEq/L (ref 96–112)
Creatinine, Ser: 2.8 mg/dL — ABNORMAL HIGH (ref 0.40–1.50)
GFR: 26.56 mL/min — ABNORMAL LOW (ref >60.00)
Glucose, Bld: 107 mg/dL — ABNORMAL HIGH (ref 70–99)
Potassium: 4.1 mEq/L (ref 3.5–5.1)
Sodium: 143 mEq/L (ref 135–145)
Total Bilirubin: 0.5 mg/dL (ref 0.2–1.2)
Total Protein: 7 g/dL (ref 6.0–8.3)

## 2022-12-09 LAB — VITAMIN D 25 HYDROXY (VIT D DEFICIENCY, FRACTURES): VITD: 30.2 ng/mL (ref 30.00–100.00)

## 2022-12-09 LAB — PHOSPHORUS: Phosphorus: 3.2 mg/dL (ref 2.3–4.6)

## 2022-12-16 ENCOUNTER — Ambulatory Visit: Payer: 59 | Admitting: Family Medicine

## 2022-12-16 ENCOUNTER — Encounter: Payer: Self-pay | Admitting: Family Medicine

## 2022-12-16 VITALS — BP 122/74 | HR 91 | Temp 96.7°F | Ht 70.0 in | Wt 282.0 lb

## 2022-12-16 DIAGNOSIS — I10 Essential (primary) hypertension: Secondary | ICD-10-CM

## 2022-12-16 DIAGNOSIS — E559 Vitamin D deficiency, unspecified: Secondary | ICD-10-CM

## 2022-12-16 DIAGNOSIS — E1122 Type 2 diabetes mellitus with diabetic chronic kidney disease: Secondary | ICD-10-CM

## 2022-12-16 DIAGNOSIS — E1121 Type 2 diabetes mellitus with diabetic nephropathy: Secondary | ICD-10-CM

## 2022-12-16 DIAGNOSIS — Z0001 Encounter for general adult medical examination with abnormal findings: Secondary | ICD-10-CM

## 2022-12-16 DIAGNOSIS — E1169 Type 2 diabetes mellitus with other specified complication: Secondary | ICD-10-CM

## 2022-12-16 DIAGNOSIS — N529 Male erectile dysfunction, unspecified: Secondary | ICD-10-CM

## 2022-12-16 DIAGNOSIS — N184 Chronic kidney disease, stage 4 (severe): Secondary | ICD-10-CM

## 2022-12-16 DIAGNOSIS — E113313 Type 2 diabetes mellitus with moderate nonproliferative diabetic retinopathy with macular edema, bilateral: Secondary | ICD-10-CM | POA: Diagnosis not present

## 2022-12-16 DIAGNOSIS — Z7984 Long term (current) use of oral hypoglycemic drugs: Secondary | ICD-10-CM | POA: Diagnosis not present

## 2022-12-16 DIAGNOSIS — G4733 Obstructive sleep apnea (adult) (pediatric): Secondary | ICD-10-CM

## 2022-12-16 DIAGNOSIS — E785 Hyperlipidemia, unspecified: Secondary | ICD-10-CM

## 2022-12-16 DIAGNOSIS — Z0000 Encounter for general adult medical examination without abnormal findings: Secondary | ICD-10-CM

## 2022-12-16 DIAGNOSIS — E118 Type 2 diabetes mellitus with unspecified complications: Secondary | ICD-10-CM

## 2022-12-16 DIAGNOSIS — Z7985 Long-term (current) use of injectable non-insulin antidiabetic drugs: Secondary | ICD-10-CM | POA: Diagnosis not present

## 2022-12-16 DIAGNOSIS — N2581 Secondary hyperparathyroidism of renal origin: Secondary | ICD-10-CM

## 2022-12-16 LAB — POCT GLYCOSYLATED HEMOGLOBIN (HGB A1C): Hemoglobin A1C: 6.8 % — AB (ref 4.0–5.6)

## 2022-12-16 MED ORDER — SILDENAFIL CITRATE 100 MG PO TABS: 50.0000 mg | ORAL_TABLET | ORAL | 3 refills | Status: DC | PRN

## 2022-12-16 MED ORDER — METFORMIN HCL ER 500 MG PO TB24
500.0000 mg | ORAL_TABLET | Freq: Every day | ORAL | 4 refills | Status: DC
Start: 2022-12-16 — End: 2023-12-22

## 2022-12-16 MED ORDER — AMLODIPINE BESYLATE 5 MG PO TABS
5.0000 mg | ORAL_TABLET | Freq: Every day | ORAL | 4 refills | Status: DC
Start: 2022-12-16 — End: 2023-05-26

## 2022-12-16 MED ORDER — SEMAGLUTIDE (1 MG/DOSE) 4 MG/3ML ~~LOC~~ SOPN: 1.0000 mg | PEN_INJECTOR | SUBCUTANEOUS | 4 refills | Status: DC

## 2022-12-16 MED ORDER — LOSARTAN POTASSIUM 100 MG PO TABS
100.0000 mg | ORAL_TABLET | Freq: Every day | ORAL | 4 refills | Status: DC
Start: 2022-12-16 — End: 2023-12-22

## 2022-12-16 MED ORDER — CARVEDILOL 6.25 MG PO TABS
6.2500 mg | ORAL_TABLET | Freq: Two times a day (BID) | ORAL | 4 refills | Status: DC
Start: 2022-12-16 — End: 2023-12-22

## 2022-12-16 MED ORDER — D3-50 1.25 MG (50000 UT) PO CAPS: 50000.0000 [IU] | ORAL_CAPSULE | ORAL | 3 refills | Status: DC

## 2022-12-16 MED ORDER — ATORVASTATIN CALCIUM 40 MG PO TABS
40.0000 mg | ORAL_TABLET | Freq: Every day | ORAL | 4 refills | Status: DC
Start: 2022-12-16 — End: 2023-12-22

## 2022-12-16 NOTE — Assessment & Plan Note (Signed)
Regularly sees ophthalmologist.

## 2022-12-16 NOTE — Progress Notes (Signed)
Ph: [phone] Fax: [phone]   Patient ID: Jason Whitney, male    DOB: [birth year], 47 y.o.   MRN: [id number]  This visit was conducted in person.  BP 122/74   Pulse 91   Temp (!) 96.7 F (35.9 C) (Temporal)   Ht 5\' 10"  (1.778 m)   Wt 282 lb (127.9 kg)   SpO2 97%   BMI 40.46 kg/m    CC: CPE Subjective:   HPI: Jason Whitney is a 47 y.o. male presenting on 12/16/2022 for Annual Exam   Has 8yo ASD son.   HTN and DM with CKD stage 4 sees Dr Thedore Mins nephrologist - weight has stabilized. Upcoming appt 01/24/2023 with renal. Continues amlodipine, carvedilol, jardiance, finereone, losartan, metformin and ozempic. Doesn't check sugars at home.  Regularly sees eye doctor.   No known fmhx kidney disease.   Preventative: No fmhx prostate or colon cancer  Flu shot - declines COVID vaccine - declines Pneumovax 11/2012 Tdap 2012, declines for now Seat belt use discussed. Sunscreen use discussed. No changing moles on skin.  Non smoker Alcohol - rare  Dentist - q6 mo Eye exam yearly last seen 01/2022. Sees [facility].    Lives with girlfriend and daughter (2006); no pets  12th grade education  Occ: truck Hospital doctor for [employer]  Activity: no regular exercise  Diet: good water, fruits/vegetables daily      Relevant past medical, surgical, family and social history reviewed and updated as indicated. Interim medical history since our last visit reviewed. Allergies and medications reviewed and updated. Outpatient Medications Prior to Visit  Medication Sig Dispense Refill   Blood Glucose Monitoring Suppl (ONE TOUCH ULTRA MINI) w/Device KIT Use as directed 1 kit 0   glucose blood (ONE TOUCH ULTRA TEST) test strip CHECK TWICE DAILY 200 each 3   JARDIANCE 10 MG TABS tablet Take 10 mg by mouth daily.     KERENDIA 10 MG TABS Take 1 tablet by mouth daily.     amLODipine (NORVASC) 5 MG tablet Take 1 tablet (5 mg total) by mouth daily. 90 tablet 1   atorvastatin (LIPITOR) 40 MG tablet Take  1 tablet (40 mg total) by mouth daily. 90 tablet 2   carvedilol (COREG) 6.25 MG tablet Take 1 tablet (6.25 mg total) by mouth 2 (two) times daily.     Cholecalciferol (D3-50) 1.25 MG (50000 UT) capsule Take 1 capsule (50,000 Units total) by mouth once a week. 12 capsule 3   losartan (COZAAR) 100 MG tablet TAKE 1 TABLET BY MOUTH EVERY DAY 90 tablet 0   metFORMIN (GLUCOPHAGE-XR) 750 MG 24 hr tablet Take 1 tablet (750 mg total) by mouth daily with breakfast. 90 tablet 1   Semaglutide, 1 MG/DOSE, 4 MG/3ML SOPN Inject 1 mg as directed once a week. 9 mL 3   sildenafil (VIAGRA) 100 MG tablet TAKE 1/2 - 1 TABLET BY MOUTH DAILY AS NEEDED FOR ERECTILE DYSFUNCTION 5 tablet 1   predniSONE (DELTASONE) 20 MG tablet 2 tabs po daily for 5 days, then 1 tab po daily for 5 days 15 tablet 0   No facility-administered medications prior to visit.     Per HPI unless specifically indicated in ROS section below Review of Systems  Constitutional:  Negative for activity change, appetite change, chills, fatigue, fever and unexpected weight change.  HENT:  Negative for hearing loss.   Eyes:  Negative for visual disturbance.  Respiratory:  Negative for cough, chest tightness, shortness of breath and wheezing.  Cardiovascular:  Negative for chest pain, palpitations and leg swelling.  Gastrointestinal:  Negative for abdominal distention, abdominal pain, blood in stool, constipation, diarrhea, nausea and vomiting.  Genitourinary:  Negative for difficulty urinating and hematuria.  Musculoskeletal:  Negative for arthralgias, myalgias and neck pain.  Skin:  Negative for rash.  Neurological:  Negative for dizziness, seizures, syncope and headaches.  Hematological:  Negative for adenopathy. Does not bruise/bleed easily.  Psychiatric/Behavioral:  Negative for dysphoric mood. The patient is not nervous/anxious.     Objective:  BP 122/74   Pulse 91   Temp (!) 96.7 F (35.9 C) (Temporal)   Ht 5\' 10"  (1.778 m)   Wt 282 lb  (127.9 kg)   SpO2 97%   BMI 40.46 kg/m   Wt Readings from Last 3 Encounters:  12/16/22 282 lb (127.9 kg)  10/28/22 282 lb (127.9 kg)  09/16/22 281 lb 2 oz (127.5 kg)      Physical Exam Vitals and nursing note reviewed.  Constitutional:      General: He is not in acute distress.    Appearance: Normal appearance. He is well-developed. He is not ill-appearing.  HENT:     Head: Normocephalic and atraumatic.     Right Ear: Hearing, tympanic membrane, ear canal and external ear normal.     Left Ear: Hearing, tympanic membrane, ear canal and external ear normal.     Nose: Nose normal.     Mouth/Throat:     Mouth: Mucous membranes are moist.     Pharynx: Oropharynx is clear. No oropharyngeal exudate or posterior oropharyngeal erythema.  Eyes:     General: No scleral icterus.    Extraocular Movements: Extraocular movements intact.     Conjunctiva/sclera: Conjunctivae normal.     Pupils: Pupils are equal, round, and reactive to light.  Neck:     Thyroid: No thyroid mass or thyromegaly.  Cardiovascular:     Rate and Rhythm: Normal rate and regular rhythm.     Pulses: Normal pulses.          Radial pulses are 2+ on the right side and 2+ on the left side.     Heart sounds: Normal heart sounds. No murmur heard. Pulmonary:     Effort: Pulmonary effort is normal. No respiratory distress.     Breath sounds: Normal breath sounds. No wheezing, rhonchi or rales.  Abdominal:     General: Bowel sounds are normal. There is no distension.     Palpations: Abdomen is soft. There is no mass.     Tenderness: There is no abdominal tenderness. There is no guarding or rebound.     Hernia: No hernia is present.  Musculoskeletal:        General: Normal range of motion.     Cervical back: Normal range of motion and neck supple.     Right lower leg: No edema.     Left lower leg: No edema.  Lymphadenopathy:     Cervical: No cervical adenopathy.  Skin:    General: Skin is warm and dry.     Findings:  No rash.  Neurological:     General: No focal deficit present.     Mental Status: He is alert and oriented to person, place, and time.  Psychiatric:        Mood and Affect: Mood normal.        Behavior: Behavior normal.        Thought Content: Thought content normal.  Judgment: Judgment normal.       Results for orders placed or performed in visit on 12/16/22  POCT glycosylated hemoglobin (Hb A1C)  Result Value Ref Range   Hemoglobin A1C 6.8 (A) 4.0 - 5.6 %   HbA1c POC (<> result, manual entry)     HbA1c, POC (prediabetic range)     HbA1c, POC (controlled diabetic range)     Lab Results  Component Value Date   NA 143 12/09/2022   CL 108 12/09/2022   K 4.1 12/09/2022   CO2 24 12/09/2022   BUN 33 (H) 12/09/2022   CREATININE 2.80 (H) 12/09/2022   GFR 26.56 (L) 12/09/2022   CALCIUM 8.9 12/09/2022   PHOS 3.2 12/09/2022   ALBUMIN 3.7 12/09/2022   GLUCOSE 107 (H) 12/09/2022    Lab Results  Component Value Date   CHOL 210 (H) 12/09/2022   HDL 32.40 (L) 12/09/2022   LDLCALC 143 (H) 12/09/2022   LDLDIRECT 108.0 05/31/2022   TRIG 174.0 (H) 12/09/2022   CHOLHDL 6 12/09/2022    Lab Results  Component Value Date   WBC 10.5 12/09/2022   HGB 11.7 (L) 12/09/2022   HCT 35.9 (L) 12/09/2022   MCV 87.1 12/09/2022   PLT 316.0 12/09/2022    Assessment & Plan:   Problem List Items Addressed This Visit     Encounter for general adult medical examination with abnormal findings - Primary (Chronic)    Preventative protocols reviewed and updated unless pt declined. Discussed healthy diet and lifestyle.       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       Relevant Medications   atorvastatin (LIPITOR) 40 MG tablet   metFORMIN (GLUCOPHAGE-XR) 500 MG 24 hr tablet   losartan (COZAAR) 100 MG tablet   Semaglutide, 1 MG/DOSE, 4 MG/3ML SOPN   Hyperlipidemia associated with type 2  diabetes mellitus (HCC)    Chronic, deteriorated - he forgets atorvastatin. Recommend change to am dosing. Reassess at f/u visit.  The 10-year ASCVD risk score (Arnett DK, et al., 2019) is: 12.6%   Values used to calculate the score:     Age: 36 years     Sex: Male     Is Non-Hispanic African American: Yes     Diabetic: Yes     Tobacco smoker: No     Systolic Blood Pressure: 122 mmHg     Is BP treated: Yes     HDL Cholesterol: 32.4 mg/dL     Total Cholesterol: 210 mg/dL       Relevant Medications   amLODipine (NORVASC) 5 MG tablet   atorvastatin (LIPITOR) 40 MG tablet   carvedilol (COREG) 6.25 MG tablet   metFORMIN (GLUCOPHAGE-XR) 500 MG 24 hr tablet   sildenafil (VIAGRA) 100 MG tablet   losartan (COZAAR) 100 MG tablet   Semaglutide, 1 MG/DOSE, 4 MG/3ML SOPN   Severe obesity (BMI >= 40) (HCC)    Continue to encourage healthy diet and lifestyle choices to affect sustainable weight loss.       Relevant Medications   metFORMIN (GLUCOPHAGE-XR) 500 MG 24 hr tablet   Semaglutide, 1 MG/DOSE, 4 MG/3ML SOPN   Hypertension    Chronic, stable on current regimen - continue.       Relevant Medications   amLODipine (NORVASC) 5 MG tablet   atorvastatin (LIPITOR) 40 MG tablet   carvedilol (COREG) 6.25 MG tablet   sildenafil (VIAGRA) 100 MG tablet  losartan (COZAAR) 100 MG tablet   Vitamin D deficiency    Continue weekly vit D replacement.       OSA (obstructive sleep apnea)    Will need to verify he continues CPAP use.       Type II diabetes mellitus with complication (HCC)    Chronic, great control on current regimen. Will drop metformin XR to 500mg  once daily (from 750).  Discussed possibly increasing jardiance (through renal) vs ozempic (prescribed by me)- he should check with renal about this.       Relevant Medications   atorvastatin (LIPITOR) 40 MG tablet   metFORMIN (GLUCOPHAGE-XR) 500 MG 24 hr tablet   losartan (COZAAR) 100 MG tablet   Semaglutide, 1 MG/DOSE, 4  MG/3ML SOPN   Other Relevant Orders   POCT glycosylated hemoglobin (Hb A1C) (Completed)   CKD stage 4 due to type 2 diabetes mellitus (HCC)    Chronic, appreciate renal care. Sees regularly.  Consider increased jardiance dose as we taper metformin      Relevant Medications   atorvastatin (LIPITOR) 40 MG tablet   metFORMIN (GLUCOPHAGE-XR) 500 MG 24 hr tablet   losartan (COZAAR) 100 MG tablet   Semaglutide, 1 MG/DOSE, 4 MG/3ML SOPN   Secondary hyperparathyroidism of renal origin (HCC)   Microalbuminuric diabetic nephropathy (HCC)   Relevant Medications   atorvastatin (LIPITOR) 40 MG tablet   metFORMIN (GLUCOPHAGE-XR) 500 MG 24 hr tablet   losartan (COZAAR) 100 MG tablet   Semaglutide, 1 MG/DOSE, 4 MG/3ML SOPN   Anemia due to stage 4 chronic kidney disease (HCC)   Erectile dysfunction    Viagra refilled.       Relevant Medications   sildenafil (VIAGRA) 100 MG tablet     Meds ordered this encounter  Medications   amLODipine (NORVASC) 5 MG tablet    Sig: Take 1 tablet (5 mg total) by mouth daily.    Dispense:  90 tablet    Refill:  4   atorvastatin (LIPITOR) 40 MG tablet    Sig: Take 1 tablet (40 mg total) by mouth daily.    Dispense:  90 tablet    Refill:  4   carvedilol (COREG) 6.25 MG tablet    Sig: Take 1 tablet (6.25 mg total) by mouth 2 (two) times daily.    Dispense:  180 tablet    Refill:  4   metFORMIN (GLUCOPHAGE-XR) 500 MG 24 hr tablet    Sig: Take 1 tablet (500 mg total) by mouth daily with breakfast.    Dispense:  90 tablet    Refill:  4    Note new sig   sildenafil (VIAGRA) 100 MG tablet    Sig: Take 0.5-1 tablets (50-100 mg total) by mouth as needed for erectile dysfunction.    Dispense:  5 tablet    Refill:  3   losartan (COZAAR) 100 MG tablet    Sig: Take 1 tablet (100 mg total) by mouth daily.    Dispense:  90 tablet    Refill:  4   Semaglutide, 1 MG/DOSE, 4 MG/3ML SOPN    Sig: Inject 1 mg as directed once a week.    Dispense:  9 mL    Refill:   4   Cholecalciferol (D3-50) 1.25 MG (50000 UT) capsule    Sig: Take 1 capsule (50,000 Units total) by mouth once a week.    Dispense:  12 capsule    Refill:  3    Orders Placed This Encounter  Procedures  POCT glycosylated hemoglobin (Hb A1C)    Patient Instructions  Drop metformin XR to 500mg  once daily.  Take atorvastatin in the morning if more easy to remember.  Return in 6 months for diabetes follow up visit    Follow up plan: Return in about 6 months (around 06/18/2023), or if symptoms worsen or fail to improve, for follow up visit.  Eustaquio Boyden, MD

## 2022-12-16 NOTE — Assessment & Plan Note (Signed)
Continue to encourage healthy diet and lifestyle choices to affect sustainable weight loss.  °

## 2022-12-16 NOTE — Assessment & Plan Note (Signed)
Viagra refilled

## 2022-12-16 NOTE — Assessment & Plan Note (Signed)
Preventative protocols reviewed and updated unless pt declined. Discussed healthy diet and lifestyle.  

## 2022-12-16 NOTE — Assessment & Plan Note (Signed)
Will need to verify he continues CPAP use.

## 2022-12-16 NOTE — Assessment & Plan Note (Signed)
Chronic, appreciate renal care. Sees regularly.  Consider increased jardiance dose as we taper metformin

## 2022-12-16 NOTE — Assessment & Plan Note (Signed)
Chronic, deteriorated - he forgets atorvastatin. Recommend change to am dosing. Reassess at f/u visit.  The 10-year ASCVD risk score (Arnett DK, et al., 2019) is: 12.6%   Values used to calculate the score:     Age: 47 years     Sex: Male     Is Non-Hispanic African American: Yes     Diabetic: Yes     Tobacco smoker: No     Systolic Blood Pressure: 122 mmHg     Is BP treated: Yes     HDL Cholesterol: 32.4 mg/dL     Total Cholesterol: 210 mg/dL

## 2022-12-16 NOTE — Assessment & Plan Note (Signed)
Continue weekly vit D replacement. 

## 2022-12-16 NOTE — Assessment & Plan Note (Addendum)
Chronic, great control on current regimen. Will drop metformin XR to 500mg  once daily (from 750).  Discussed possibly increasing jardiance (through renal) vs ozempic (prescribed by me)- he should check with renal about this.

## 2022-12-16 NOTE — Patient Instructions (Addendum)
Drop metformin XR to 500mg  once daily.  Take atorvastatin in the morning if more easy to remember.  Return in 6 months for diabetes follow up visit

## 2022-12-16 NOTE — Assessment & Plan Note (Signed)
Chronic, stable on current regimen - continue. 

## 2023-04-22 ENCOUNTER — Other Ambulatory Visit: Payer: Self-pay | Admitting: Family Medicine

## 2023-04-22 DIAGNOSIS — I10 Essential (primary) hypertension: Secondary | ICD-10-CM

## 2023-04-22 DIAGNOSIS — N529 Male erectile dysfunction, unspecified: Secondary | ICD-10-CM

## 2023-04-22 DIAGNOSIS — E118 Type 2 diabetes mellitus with unspecified complications: Secondary | ICD-10-CM

## 2023-04-22 NOTE — Telephone Encounter (Signed)
Per 12/16/22 OV notes, metformin XR was decreased to 500 mg once daily. Rx sent same day. Also, rx for amlodipine 5 mg sent 12/16/22.  These 2 requests denied.

## 2023-05-26 ENCOUNTER — Encounter: Payer: Self-pay | Admitting: Family Medicine

## 2023-05-26 ENCOUNTER — Ambulatory Visit (INDEPENDENT_AMBULATORY_CARE_PROVIDER_SITE_OTHER)
Admission: RE | Admit: 2023-05-26 | Discharge: 2023-05-26 | Disposition: A | Discharge status: Home / Self Care | Payer: 59 | Source: Ambulatory Visit | Attending: Family Medicine | Admitting: Family Medicine

## 2023-05-26 ENCOUNTER — Ambulatory Visit: Payer: 59 | Admitting: Family Medicine

## 2023-05-26 VITALS — BP 124/70 | HR 91 | Temp 97.8°F | Ht 70.0 in | Wt 286.0 lb

## 2023-05-26 DIAGNOSIS — M79672 Pain in left foot: Secondary | ICD-10-CM | POA: Diagnosis not present

## 2023-05-26 DIAGNOSIS — S93502A Unspecified sprain of left great toe, initial encounter: Secondary | ICD-10-CM | POA: Diagnosis not present

## 2023-05-26 MED ORDER — PREDNISONE 20 MG PO TABS: ORAL_TABLET | ORAL | 0 refills | Status: DC

## 2023-05-26 NOTE — Progress Notes (Signed)
 Jason Momon T. Jason Bostwick, MD, CAQ [facility] at [facility] [address] [place], [address]  Phone: [phone]  FAX: [phone]  Jason Whitney - 48 y.o. male  MRN [id number]  Date of Birth: Feb 06, 1976  Date: 05/26/2023  PCP: Rilla Baller, MD  Referral: Rilla Baller, MD  Chief Complaint  Patient presents with   Foot Injury    Tripped over Curb last Thursday   Subjective:   Jason Whitney is a 48 y.o. very pleasant male patient with Body mass index is 41.04 kg/m. who presents with the following:  Tripped on a curb last week, was wearing steel toe boots.  He had his initial accident on Thursday, then a day or 2 later his great toe and MTP joint started to hurt.  He has had some mild swelling and he has pain directly at the MTP joint.  He has not had any bruising.  He had a similar injury more than a year ago than I saw him for at the left MTP.  Hypertension Was taking coreg  once a day only -he is taking to 6.25 mg tablet at a time Change to BID dosing  Tripped at work -   BP Readings from Last 3 Encounters:  05/26/23 124/70  12/16/22 122/74  10/28/22 120/88    Bipartite sesamoid -     Review of Systems is noted in the HPI, as appropriate  Objective:   BP 124/70 (BP Location: Left Arm, Patient Position: Sitting, Cuff Size: Large)   Pulse 91   Temp 97.8 F (36.6 C) (Temporal)   Ht 5' 10 (1.778 m)   Wt 286 lb (129.7 kg)   SpO2 98%   BMI 41.04 kg/m   GEN: No acute distress; alert,appropriate. PULM: Breathing comfortably in no respiratory distress PSYCH: Normally interactive.   Left foot was evaluated vigorously.  All phalanges are nontender.  He has tenderness directly at the MTP joint on the left first ray He has no tenderness along the course of all of his metatarsals, midfoot, hindfoot, ankle, and calcaneus.  Specifically, I directly palpated his sesamoid bones quite a bit, and these were not  tender  Laboratory and Imaging Data: DG Foot Complete Left Result Date: 05/26/2023 CLINICAL DATA:  Trauma and pain over the first metatarsal. EXAM: LEFT FOOT - COMPLETE 3+ VIEW COMPARISON:  None Available. FINDINGS: There is bipartite appearance of in the medial hallux sesamoid. An acute fracture is less likely. Correlation with point tenderness recommended. No other acute fracture. The bones are well mineralized. No significant arthritic changes. Vascular calcifications noted. The the soft tissues are unremarkable. IMPRESSION: Bipartite medial hallux sesamoid versus less likely a fracture. Clinical correlation is recommended. Electronically Signed   By: Vanetta Chou M.D.   On: 05/26/2023 10:41     Assessment and Plan:     ICD-10-CM   1. Sprain of left great toe, initial encounter  S93.502A     2. Acute foot pain, left  M79.672 DG Foot Complete Left     I think this is a first MTP sprain.  I reviewed the x-rays with radiology verbally on the phone, and I think that clinically this change on the sesamoid is most likely chronic versus bipartite sesamoid.  Do not think that this is an acute fracture.  I gave him some Poron to put underneath issues at the metatarsal heads, and also will give him some prednisone  acutely.  I am going to have him rest for a few days  and then return to work.  Medication Management during today's office visit: Meds ordered this encounter  Medications   predniSONE  (DELTASONE ) 20 MG tablet    Sig: 2 tabs po daily for 5 days, then 1 tab po daily for 5 days    Dispense:  15 tablet    Refill:  0   Medications Discontinued During This Encounter  Medication Reason   amLODipine  (NORVASC ) 5 MG tablet Dose change    Orders placed today for conditions managed today: Orders Placed This Encounter  Procedures   DG Foot Complete Left    Disposition: No follow-ups on file.  [employer] speech-to-text software was used for transcription in this  dictation.  Possible transcriptional errors can occur using Animal nutritionist.   Signed,  Jacques DASEN. Kelsen Celona, MD   Outpatient Encounter Medications as of 05/26/2023  Medication Sig   amLODipine  (NORVASC ) 10 MG tablet Take 10 mg by mouth daily.   atorvastatin  (LIPITOR) 40 MG tablet Take 1 tablet (40 mg total) by mouth daily.   Blood Glucose Monitoring Suppl (ONE TOUCH ULTRA MINI) w/Device KIT Use as directed   carvedilol  (COREG ) 6.25 MG tablet Take 1 tablet (6.25 mg total) by mouth 2 (two) times daily.   Cholecalciferol (D3-50) 1.25 MG (50000 UT) capsule Take 1 capsule (50,000 Units total) by mouth once a week.   glucose blood (ONE TOUCH ULTRA TEST) test strip CHECK TWICE DAILY   JARDIANCE 10 MG TABS tablet Take 10 mg by mouth daily.   KERENDIA 10 MG TABS Take 1 tablet by mouth daily.   losartan  (COZAAR ) 100 MG tablet Take 1 tablet (100 mg total) by mouth daily.   metFORMIN  (GLUCOPHAGE -XR) 500 MG 24 hr tablet Take 1 tablet (500 mg total) by mouth daily with breakfast.   predniSONE  (DELTASONE ) 20 MG tablet 2 tabs po daily for 5 days, then 1 tab po daily for 5 days   Semaglutide , 1 MG/DOSE, 4 MG/3ML SOPN Inject 1 mg as directed once a week.   sildenafil  (VIAGRA ) 100 MG tablet TAKE 1/2 - 1 TABLET BY MOUTH DAILY AS NEEDED FOR ERECTILE DYSFUNCTION   [DISCONTINUED] amLODipine  (NORVASC ) 5 MG tablet Take 1 tablet (5 mg total) by mouth daily.   No facility-administered encounter medications on file as of 05/26/2023.

## 2023-05-31 LAB — LAB REPORT - SCANNED
Albumin, Urine POC: 1464.2
Creatinine, POC: 101.5 mg/dL
Microalb Creat Ratio: 1443

## 2023-06-03 ENCOUNTER — Telehealth: Payer: Self-pay | Admitting: Family Medicine

## 2023-06-03 NOTE — Telephone Encounter (Signed)
Will forward to Dr Patsy Lager who he saw for this.

## 2023-06-03 NOTE — Telephone Encounter (Signed)
It is okay to change his work note to reflect a return to work on June 04, 2023.

## 2023-06-03 NOTE — Telephone Encounter (Signed)
Patient would like to have leave extend to return to work on 06/04/23 Asked if he could have a letter to give to work states his ankle still does not feel right and he did not return to work on the dates on the letter.  Patient ask for a call once letter is ready to be picked up.

## 2023-06-04 NOTE — Telephone Encounter (Signed)
Updated work note written as instructed by Dr. Patsy Lager.  Jason Whitney notified by telephone that note is ready to be picked up at the front desk or he can also get it from his MyChart.  Patient will pick up.

## 2023-06-11 ENCOUNTER — Other Ambulatory Visit: Payer: Self-pay | Admitting: Family Medicine

## 2023-06-11 DIAGNOSIS — N529 Male erectile dysfunction, unspecified: Secondary | ICD-10-CM

## 2023-06-23 ENCOUNTER — Ambulatory Visit: Payer: 59 | Admitting: Family Medicine

## 2023-06-23 ENCOUNTER — Encounter: Payer: Self-pay | Admitting: Family Medicine

## 2023-06-23 VITALS — BP 156/88 | HR 99 | Temp 99.0°F | Ht 70.0 in | Wt 283.2 lb

## 2023-06-23 DIAGNOSIS — I10 Essential (primary) hypertension: Secondary | ICD-10-CM

## 2023-06-23 DIAGNOSIS — E113313 Type 2 diabetes mellitus with moderate nonproliferative diabetic retinopathy with macular edema, bilateral: Secondary | ICD-10-CM

## 2023-06-23 DIAGNOSIS — E1121 Type 2 diabetes mellitus with diabetic nephropathy: Secondary | ICD-10-CM

## 2023-06-23 DIAGNOSIS — E118 Type 2 diabetes mellitus with unspecified complications: Secondary | ICD-10-CM

## 2023-06-23 DIAGNOSIS — Z01818 Encounter for other preprocedural examination: Secondary | ICD-10-CM

## 2023-06-23 DIAGNOSIS — E1122 Type 2 diabetes mellitus with diabetic chronic kidney disease: Secondary | ICD-10-CM

## 2023-06-23 DIAGNOSIS — G4733 Obstructive sleep apnea (adult) (pediatric): Secondary | ICD-10-CM

## 2023-06-23 DIAGNOSIS — E1169 Type 2 diabetes mellitus with other specified complication: Secondary | ICD-10-CM

## 2023-06-23 DIAGNOSIS — N184 Chronic kidney disease, stage 4 (severe): Secondary | ICD-10-CM

## 2023-06-23 LAB — POCT GLYCOSYLATED HEMOGLOBIN (HGB A1C): Hemoglobin A1C: 6.8 % — AB (ref 4.0–5.6)

## 2023-06-23 NOTE — Assessment & Plan Note (Addendum)
 Will request latest diabetic eye exam.

## 2023-06-23 NOTE — Assessment & Plan Note (Signed)
 Consider increasing ozempic .

## 2023-06-23 NOTE — Assessment & Plan Note (Signed)
 Continue CPAP use

## 2023-06-23 NOTE — Assessment & Plan Note (Addendum)
 Chronic, stable on current regimen - continue this.  Discussed increasing ozempic  dose, will hold for now and await planned dental surgery.  Recommend he hold ozempic  1 wk prior to planned dental surgery

## 2023-06-23 NOTE — Assessment & Plan Note (Signed)
 Chronic, appreciate renal care Jason Whitney). Recent nephrology labs through reviewed.

## 2023-06-23 NOTE — Assessment & Plan Note (Addendum)
 Upcoming dental work with full implants through periodontist.  RCRI = 1 (Cr >2) = 6% risk of major cardiac event. Pt asxs.  Anticipate adequately low risk to proceed with planned intervention  Discussed holding ozempic  1 wk prior to surgery.

## 2023-06-23 NOTE — Patient Instructions (Addendum)
 Good to see you today Continue current medicines. BP was elevated today - continue tracking blood pressures and let me know if staying elevated at home. Start monitoring with BP log sheet provided today, drop off in 1-2 wks to review Return in 3 weeks for BP check Return in 6 months for physical

## 2023-06-23 NOTE — Progress Notes (Addendum)
Ph: [phone] Fax: [phone]   Patient ID: Jason Whitney, male    DOB: 04/22/1976, 48 y.o.   MRN: [id number]  This visit was conducted in person.  BP (!) 156/88   Pulse 99   Temp 99 F (37.2 C) (Oral)   Ht 5\' 10"  (1.778 m)   Wt 283 lb 4 oz (128.5 kg)   SpO2 99%   BMI 40.64 kg/m   168/90s on repeat testing  BP Readings from Last 3 Encounters:  06/23/23 (!) 156/88  05/26/23 124/70  12/16/22 122/74   CC: 6 mo DM f/u visit  Subjective:   HPI: Jason Whitney is a 48 y.o. male presenting on 06/23/2023 for Medical Management of Chronic Issues (Here for 6 mo DM f/u.)   [facility] - upcoming dental surgery with full implants March 6th under anesthesia.   Family members sick - son and wife with bad cough. 2d h/o rhinorrhea in am, no fevers/chills, HA, cough or ST. He declines swabs today.   Recently completed prednisone course for toe strain. Toe is better.   OSA continues CPAP.   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   HTN - Compliant with current antihypertensive regimen of amlodipine 10mg  daily, losartan 100mg  daily, carvedilol 6.25mg  bid, finerenone Chauncey Mann) 10mg  daily. Does check blood pressures at home: overall ok, a few highs. Last night 136/83. No low blood pressure readings or symptoms of dizziness/syncope. Denies HA, vision changes, CP/tightness, SOB, leg swelling.    DM - does regularly check sugars 140s after meal, 75-80 fasting. Compliant with antihyperglycemic regimen which includes: jardiance 10mg  daily, metformin XR 500mg  daily, ozempic 1mg  weekly. Denies low sugars or hypoglycemic symptoms. Denies paresthesias, blurry vision. Last diabetic eye exam done at [facility] [place] - records requested. Glucometer brand: one-touch ultra mini. Last foot exam: DUE 02/2022. DSME: Upcoming nutritionist appt 06/30/2023  - referred by Dr Thedore Mins. Lab Results  Component Value Date   HGBA1C 6.8 (A) 06/23/2023   Diabetic Foot Exam - Simple   No data filed    Lab Results  Component Value Date   MICROALBUR 180.0 (H) 12/09/2022         Relevant past medical, surgical, family and social history reviewed and updated as indicated. Interim medical history since our last visit reviewed. Allergies and medications reviewed and updated. Outpatient Medications Prior to Visit  Medication Sig Dispense Refill   amLODipine (NORVASC) 10 MG tablet Take 10 mg by mouth daily.     atorvastatin (LIPITOR) 40 MG tablet Take 1 tablet (40 mg total) by mouth daily. 90 tablet 4   Blood Glucose Monitoring Suppl (ONE TOUCH ULTRA MINI) w/Device KIT Use as directed 1 kit 0   carvedilol (COREG) 6.25 MG tablet Take 1 tablet (6.25 mg total) by mouth 2 (two) times daily. 180 tablet 4   Cholecalciferol (D3-50) 1.25 MG (50000 UT) capsule Take 1 capsule (50,000 Units total) by mouth once a week. 12 capsule 3   glucose blood (ONE TOUCH ULTRA TEST) test strip CHECK TWICE DAILY 200 each 3   JARDIANCE 10 MG TABS tablet Take 10 mg by mouth daily.     KERENDIA 10 MG TABS Take 1 tablet by mouth daily.     losartan (COZAAR) 100 MG tablet Take 1 tablet (100 mg total) by mouth daily. 90 tablet 4   metFORMIN (GLUCOPHAGE-XR) 500 MG 24 hr tablet Take 1 tablet (500  mg total) by mouth daily with breakfast. 90 tablet 4   Semaglutide, 1 MG/DOSE, 4 MG/3ML SOPN Inject 1 mg as directed once a week. 9 mL 4   sildenafil (VIAGRA) 100 MG tablet TAKE 1/2 TO 1 TABLET (50-100 MG TOTAL) BY MOUTH AS NEEDED FOR ERECTILE DYSFUNCTION 5 tablet 7   predniSONE (DELTASONE) 20 MG tablet 2 tabs po daily for 5 days, then 1 tab po daily for 5 days 15 tablet 0   No facility-administered medications prior to visit.     Per HPI unless specifically indicated in ROS section below Review of Systems  Objective:  BP (!) 156/88   Pulse 99   Temp 99 F (37.2 C) (Oral)   Ht 5\' 10"   (1.778 m)   Wt 283 lb 4 oz (128.5 kg)   SpO2 99%   BMI 40.64 kg/m   Wt Readings from Last 3 Encounters:  06/23/23 283 lb 4 oz (128.5 kg)  05/26/23 286 lb (129.7 kg)  12/16/22 282 lb (127.9 kg)      Physical Exam Vitals and nursing note reviewed.  Constitutional:      Appearance: Normal appearance. He is not ill-appearing.  HENT:     Head: Normocephalic and atraumatic.     Mouth/Throat:     Comments: Wearing mask  Eyes:     Extraocular Movements: Extraocular movements intact.     Conjunctiva/sclera: Conjunctivae normal.     Pupils: Pupils are equal, round, and reactive to light.  Cardiovascular:     Rate and Rhythm: Normal rate and regular rhythm.     Pulses: Normal pulses.     Heart sounds: Normal heart sounds. No murmur heard. Pulmonary:     Effort: Pulmonary effort is normal. No respiratory distress.     Breath sounds: Normal breath sounds. No wheezing, rhonchi or rales.  Musculoskeletal:     Right lower leg: No edema.     Left lower leg: No edema.     Comments: See HPI for foot exam if done  Skin:    General: Skin is warm and dry.     Findings: No rash.  Neurological:     Mental Status: He is alert.  Psychiatric:        Mood and Affect: Mood normal.        Behavior: Behavior normal.       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       Severe obesity (BMI >= 40) (HCC)   Consider increasing  ozempic.       Hypertension   Chronic, deteriorated.  He just recently took BP meds this morning, also feeling under the weather.  BP log sheet provided to keep track of readings at home, send me in 1-2 wks. RTC 3 wks HTN f/u visit prior to planned dental surgery.  Relevant Orders   EKG 12-Lead (Completed)   OSA (obstructive sleep apnea)   Continue CPAP use.       Type 2 diabetes mellitus with other specified complication (HCC) - Primary   Chronic, stable on current regimen - continue this.  Discussed increasing ozempic dose, will hold for now and await planned dental surgery.  Recommend he hold ozempic 1 wk prior to planned dental surgery      Relevant Orders   POCT glycosylated hemoglobin (Hb A1C) (Completed)   CKD stage 4 due to type 2 diabetes mellitus (HCC)   Chronic, appreciate renal care Thedore Mins). Recent nephrology labs through reviewed.       Microalbuminuric diabetic nephropathy (HCC)   Pre-op evaluation   Upcoming dental work with full implants through periodontist.  RCRI = 1 (Cr >2) = 6% risk of major cardiac event. Pt asxs.  Anticipate adequately low risk to proceed with planned intervention  Discussed holding ozempic 1 wk prior to surgery.         No orders of the defined types were placed in this encounter.   Orders Placed This Encounter  Procedures   POCT glycosylated hemoglobin (Hb A1C)   EKG 12-Lead    Patient Instructions  Good to see you today Continue current medicines. BP was elevated today - continue tracking blood pressures and let me know if staying elevated at home. Start monitoring with BP log sheet provided today, drop off in 1-2 wks to review Return in 3 weeks for BP check Return in 6 months for physical   Follow up plan: Return in about 6 months (around 12/21/2023), or if symptoms worsen or fail to improve, for annual exam, prior fasting for blood work.  Eustaquio Boyden, MD

## 2023-06-23 NOTE — Assessment & Plan Note (Signed)
 Chronic, deteriorated.  He just recently took BP meds this morning, also feeling under the weather.  BP log sheet provided to keep track of readings at home, send me in 1-2 wks. RTC 3 wks HTN f/u visit prior to planned dental surgery.

## 2023-06-25 ENCOUNTER — Telehealth: Payer: Self-pay

## 2023-06-25 NOTE — Telephone Encounter (Signed)
Received faxed medical clearance form from [facility]en Nimrod[facility]. Pt needs oral maxillofacial and dental surgery w/ prosthetic reconstruction in outpt setting.

## 2023-06-30 ENCOUNTER — Encounter: Payer: Self-pay | Admitting: Dietician

## 2023-06-30 ENCOUNTER — Encounter: Payer: 59 | Attending: Nephrology | Admitting: Dietician

## 2023-06-30 VITALS — Ht 71.0 in | Wt 285.8 lb

## 2023-06-30 DIAGNOSIS — E1122 Type 2 diabetes mellitus with diabetic chronic kidney disease: Secondary | ICD-10-CM | POA: Diagnosis present

## 2023-06-30 DIAGNOSIS — I10 Essential (primary) hypertension: Secondary | ICD-10-CM | POA: Insufficient documentation

## 2023-06-30 DIAGNOSIS — E1169 Type 2 diabetes mellitus with other specified complication: Secondary | ICD-10-CM | POA: Diagnosis present

## 2023-06-30 DIAGNOSIS — E785 Hyperlipidemia, unspecified: Secondary | ICD-10-CM | POA: Insufficient documentation

## 2023-06-30 DIAGNOSIS — N184 Chronic kidney disease, stage 4 (severe): Secondary | ICD-10-CM | POA: Diagnosis present

## 2023-06-30 NOTE — Progress Notes (Signed)
Medical Nutrition Therapy  Appointment Start time:  [phone]  Appointment End time:  0920  Primary concerns today: Weight Loss, CKD, HTN Referral diagnosis: E66.9 - Obesity Preferred learning style:No preference indicated Learning readiness: Contemplating   NUTRITION ASSESSMENT   Anthropometrics: Ht: 71" Wt: 285.8 lbs BMI: 39.86 kg/m2 Goal Weight: <250 lbs.  Clinical Medical Hx: T2DM, HTN, HLD, CKS S4, Microalbuminuria, Vit D deficiency Medications: Ozempic, Metformin, Losartan, Jardiance, Carvedilol, Amlodipine, Vit D Labs: A1c - 6.8, Microalbumin, Urine - 1.464.2,  Notable Signs/Symptoms: N/A   Lifestyle & Dietary Hx Pt reports main concern of CKD S4 and HTN, states they will have elevated readings at their doctor's office but will usually get ~120-150/80-90 when checking at home. Pt reports goal of weight loss to under 250 lbs., staes they believe this will help improve blood pressure and kidney function. Pt reports losing ~50 lbs since DM diagnosis in 2003-2004, states Ozempic has helped with weight loss over the last few years. Pt reports recently starting an herbal tea (Iron Hammer) for kidney health. Pt reports drinking a lot of fluids daily (water, soda, juice, diet green tea), has a 64 oz. Stanley cup they refill multiple times, will occasionally have a diet soda or will dilute juice (16 oz. Juice/16 oz. Water). Pt reports usually only having one meal per day, tends to snack during the day on the way home and/or in the evening. Pt states meals for dinner are sometimes made at home, more takeout/fast food.Pt reports not being hungry in the morning and never eating breakfast on work days.  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   Estimated daily fluid intake: 128 oz Supplements: Iron Hammer tea Sleep: Sleeps well Stress /  self-care: Low stress Current average weekly physical activity: ADLs, Active at work  24-Hr Dietary Recall First Meal: [employer] 2 piece (breast, thigh), dirty rice, water Snack: M&Ms Second Meal: none Snack: Juice and water Third Meal: none Snack: none Beverages: water, juice   NUTRITION DIAGNOSIS  [place]-2.2 Altered nutrition-related laboratory As related to CKD S4, DM.  As evidenced by Urine Microalbumin of 1,464.2, A1c of 6.8%, dietary recall high in sodium and SSBs.   NUTRITION INTERVENTION  Nutrition education (E-1) on the following topics:  Educated patient on an appropriate blood pressure goal of <120/80. Educated patient on the relationship between dietary sodium intake and hypertension. Recommended between 1,500 and 2,000 mg of sodium per day. Educated patient on common sources of high sodium foods including packaged/processed foods, deli meats, fast foods, pickled food, sports drinks, and canned foods. Educated patient on the potential for kidney and heart complications related to uncontrolled hypertension.  Educated patient on the pathophysiology of diabetes. Educated patient on factors that contribute to elevation of blood sugars, such as stress, illness, injury,and food choices. Discussed the role that physical activity plays in lowering blood sugar. Educate patient on the three main macronutrients. Protein, fats, and carbohydrates. Discussed how each of these macronutrients affect blood sugar levels, especially carbohydrate, and the importance of eating a consistent amount of carbohydrate throughout the day.  Educated patient on the necessity of adhering to a low potassium diet for renal health. Educated patient on food sources of potassium and choices to facilitate a low potassium diet.  Handouts Provided Include  Potassium Content of Foods CKD Stage 3-5 Nutrition Therapy  Learning Style &  Readiness for Change Teaching method utilized: Visual & Auditory  Demonstrated degree  of understanding via: Teach Back  Barriers to learning/adherence to lifestyle change: Eating away from home  Goals Established by Pt Choose ZERO SUGAR sodas in place of your regular sodas!  If diluting your juice, have only 4-8 oz. of juice and have the rest be water. You can also have a [employer] for a sugar free beverage. Work towards a goal of less than <2,000 mg of sodium per day.  When eating away from home, choose an english muffin sandwich from [employer] or McDonald's instead of a biscuit from [employer]. If having [employer], choose grilled nuggets or a plain grilled chicken sandwich for the lowest sodium options. Choose low potassium food options as often as you can! Use your "Potassium Content of Foods" list handout to help guide your choices. Keep up the great work drinking 64 oz. or more of plain water each day!!   MONITORING & EVALUATION Dietary intake, weekly physical activity, and labs in 3 months.  Next Steps  Patient is to follow up with RD after next labs.

## 2023-06-30 NOTE — Patient Instructions (Addendum)
Choose ZERO SUGAR sodas in place of your regular sodas!  If diluting your juice, have only 4-8 oz. of juice and have the rest be water. You can also have a [employer] for a sugar free beverage.  Work towards a goal of less than <2,000 mg of sodium per day.   When eating away from home, choose an english muffin sandwich from [employer] or McDonald's instead of a biscuit from [employer]. If having [employer], choose grilled nuggets or a plain grilled chicken sandwich for the lowest sodium options.  Choose low potassium food options as often as you can! Use your "Potassium Content of Foods" list handout to help guide your choices.  Keep up the great work drinking 64 oz. or more of plain water each day!!

## 2023-07-01 NOTE — Telephone Encounter (Addendum)
Printed recent OV and attached to request.  Placed in Lisa's box.

## 2023-07-03 NOTE — Telephone Encounter (Addendum)
Faxed form to [facility] at [phone].

## 2023-07-14 ENCOUNTER — Ambulatory Visit: Payer: 59 | Admitting: Family Medicine

## 2023-07-14 ENCOUNTER — Ambulatory Visit (INDEPENDENT_AMBULATORY_CARE_PROVIDER_SITE_OTHER)
Admission: RE | Admit: 2023-07-14 | Discharge: 2023-07-14 | Disposition: A | Discharge status: Home / Self Care | Source: Ambulatory Visit | Attending: Family Medicine | Admitting: Family Medicine

## 2023-07-14 ENCOUNTER — Encounter: Payer: Self-pay | Admitting: Family Medicine

## 2023-07-14 VITALS — BP 138/84 | HR 94 | Temp 98.6°F | Ht 71.0 in | Wt 280.4 lb

## 2023-07-14 DIAGNOSIS — I10 Essential (primary) hypertension: Secondary | ICD-10-CM | POA: Diagnosis not present

## 2023-07-14 DIAGNOSIS — E1169 Type 2 diabetes mellitus with other specified complication: Secondary | ICD-10-CM

## 2023-07-14 DIAGNOSIS — Z01818 Encounter for other preprocedural examination: Secondary | ICD-10-CM | POA: Diagnosis not present

## 2023-07-14 DIAGNOSIS — E1122 Type 2 diabetes mellitus with diabetic chronic kidney disease: Secondary | ICD-10-CM | POA: Diagnosis not present

## 2023-07-14 DIAGNOSIS — N184 Chronic kidney disease, stage 4 (severe): Secondary | ICD-10-CM

## 2023-07-14 NOTE — Assessment & Plan Note (Addendum)
 Has seen nutritionist.  Weight loss noted with diet changes - congratulated.

## 2023-07-14 NOTE — Progress Notes (Signed)
 Ph: [phone] Fax: [phone]   Patient ID: Jason Whitney, male    DOB: [birth year], 48 y.o.   MRN: [id number]  This visit was conducted in person.  BP 138/84 (BP Location: Left Arm, Cuff Size: Large)   Pulse 94   Temp 98.6 F (37 C) (Oral)   Ht 5\' 11"  (1.803 m)   Wt 280 lb 6 oz (127.2 kg)   SpO2 99%   BMI 39.10 kg/m   With home cuff: 140/87, HR 85 In office recheck: 138/84  CC: HTN f/u visit  Subjective:   HPI: Jason Whitney is a 48 y.o. male presenting on 07/14/2023 for Medical Management of Chronic Issues (Here for 3 wk HTN f/u. Pt brought in home BP monitor to compare. Reading at today's OV- 136/86. )   Upcoming dental surgery with implants later this week under anesthesia ([facility]).   T2DM - he held Ozempic this past week in anticipation of upcoming surgery. Saw nutritionist 06/30/2023. Started drinking zero sugar sodas with 5 lb weight loss in 3 weeks.   HTN - Compliant with current antihypertensive regimen of amlodipine 10mg  daily, carvedilol 6.25mg  bid, jardiance 10mg , kerendia 10mg , losartan 100mg  daily.  Does check blood pressures at home (130s/80s) and brings home BP monitor - measuring ~20 points diastolic lower than our BP cuff.  Denies low blood pressure readings or symptoms of dizziness/syncope. Denies HA, vision changes, CP/tightness, SOB, leg swelling.   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      Relevant past medical, surgical, family and social history reviewed and updated as indicated. Interim medical history since our last visit reviewed. Allergies and medications reviewed and updated. Outpatient Medications Prior to Visit  Medication Sig Dispense Refill   amLODipine (NORVASC) 10 MG tablet Take 10 mg by mouth daily.     atorvastatin (LIPITOR) 40 MG tablet Take 1 tablet (40 mg total) by mouth  daily. 90 tablet 4   Blood Glucose Monitoring Suppl (ONE TOUCH ULTRA MINI) w/Device KIT Use as directed 1 kit 0   carvedilol (COREG) 6.25 MG tablet Take 1 tablet (6.25 mg total) by mouth 2 (two) times daily. 180 tablet 4   Cholecalciferol (D3-50) 1.25 MG (50000 UT) capsule Take 1 capsule (50,000 Units total) by mouth once a week. 12 capsule 3   glucose blood (ONE TOUCH ULTRA TEST) test strip CHECK TWICE DAILY 200 each 3   JARDIANCE 10 MG TABS tablet Take 10 mg by mouth daily.     KERENDIA 10 MG TABS Take 1 tablet by mouth daily.     losartan (COZAAR) 100 MG tablet Take 1 tablet (100 mg total) by mouth daily. 90 tablet 4   metFORMIN (GLUCOPHAGE-XR) 500 MG 24 hr tablet Take 1 tablet (500 mg total) by mouth daily with breakfast. 90 tablet 4   Semaglutide, 1 MG/DOSE, 4 MG/3ML SOPN Inject 1 mg as directed once a week. 9 mL 4   sildenafil (VIAGRA) 100 MG tablet TAKE 1/2 TO 1 TABLET (50-100 MG TOTAL) BY MOUTH AS NEEDED FOR ERECTILE DYSFUNCTION 5 tablet 7   No facility-administered medications prior to visit.     Per HPI unless specifically indicated in ROS section below Review of Systems  Objective:  BP 138/84 (BP Location: Left Arm, Cuff Size: Large)   Pulse 94   Temp 98.6 F (37 C) (Oral)   Ht 5\' 11"  (1.803 m)   Wt 280  lb 6 oz (127.2 kg)   SpO2 99%   BMI 39.10 kg/m   Wt Readings from Last 3 Encounters:  07/14/23 280 lb 6 oz (127.2 kg)  06/30/23 285 lb 12.8 oz (129.6 kg)  06/23/23 283 lb 4 oz (128.5 kg)      Physical Exam Vitals and nursing note reviewed.  Constitutional:      Appearance: Normal appearance. He is not ill-appearing.  HENT:     Head: Normocephalic and atraumatic.     Mouth/Throat:     Mouth: Mucous membranes are moist.     Pharynx: Oropharynx is clear. No oropharyngeal exudate or posterior oropharyngeal erythema.  Eyes:     General:        Right eye: No discharge.        Left eye: No discharge.     Conjunctiva/sclera: Conjunctivae normal.     Pupils: Pupils  are equal, round, and reactive to light.  Cardiovascular:     Rate and Rhythm: Normal rate and regular rhythm.     Pulses: Normal pulses.     Heart sounds: Normal heart sounds. No murmur heard. Pulmonary:     Effort: Pulmonary effort is normal. No respiratory distress.     Breath sounds: Normal breath sounds. No wheezing, rhonchi or rales.  Musculoskeletal:     Right lower leg: No edema.     Left lower leg: No edema.  Skin:    General: Skin is warm and dry.     Findings: No rash.  Neurological:     Mental Status: He is alert.  Psychiatric:        Mood and Affect: Mood normal.        Behavior: Behavior normal.       Results for orders placed or performed in visit on 06/23/23  POCT glycosylated hemoglobin (Hb A1C)   Collection Time: 06/23/23  8:16 AM  Result Value Ref Range   Hemoglobin A1C 6.8 (A) 4.0 - 5.6 %   HbA1c POC (<> result, manual entry)     HbA1c, POC (prediabetic range)     HbA1c, POC (controlled diabetic range)     Lab Results  Component Value Date   NA 143 12/09/2022   CL 108 12/09/2022   K 4.1 12/09/2022   CO2 24 12/09/2022   BUN 33 (H) 12/09/2022   CREATININE 2.80 (H) 12/09/2022   GFR 26.56 (L) 12/09/2022   CALCIUM 8.9 12/09/2022   PHOS 3.2 12/09/2022   ALBUMIN 3.7 12/09/2022   GLUCOSE 107 (H) 12/09/2022    Assessment & Plan:   Problem List Items Addressed This Visit     Severe obesity (BMI 35.0-39.9) with comorbidity (HCC)   Has seen nutritionist.  Weight loss noted with diet changes - congratulated.       Hypertension - Primary   Chronic, improved control based on home readings and in-office repeat.  Continue current regimen.       Type 2 diabetes mellitus with other specified complication (HCC)   Chronic, sugars remain well controlled on current regimen - continue this.       CKD stage 4 due to type 2 diabetes mellitus (HCC)   Chronic, followed by nephrology Dr Thedore Mins.  Latest Cr 3.6 - discussing renal replacement therapy options.        Pre-op evaluation   RCRI = 1 (Cr >2) Update CXR.  Recent EKG reassuring. He's already held ozempic this week in preparation for upcoming dental work.       Relevant  Orders   DG Chest 2 View     No orders of the defined types were placed in this encounter.   Orders Placed This Encounter  Procedures   DG Chest 2 View    Standing Status:   Future    Number of Occurrences:   1    Expiration Date:   07/13/2024    Reason for Exam (SYMPTOM  OR DIAGNOSIS REQUIRED):   preop eval    Preferred imaging location?:   [facility]    Patient Instructions  Xray of chest today I hope you have a speedy recovery from upcoming dental work  BP was looking better today - continue current medicines.   Follow up plan: Return in about 5 months (around 12/16/2023) for annual exam, prior fasting for blood work.  Eustaquio Boyden, MD

## 2023-07-14 NOTE — Assessment & Plan Note (Signed)
 Chronic, improved control based on home readings and in-office repeat.  Continue current regimen.

## 2023-07-14 NOTE — Assessment & Plan Note (Signed)
 RCRI = 1 (Cr >2) Update CXR.  Recent EKG reassuring. He's already held ozempic this week in preparation for upcoming dental work.

## 2023-07-14 NOTE — Patient Instructions (Addendum)
 Xray of chest today I hope you have a speedy recovery from upcoming dental work  BP was looking better today - continue current medicines.

## 2023-07-14 NOTE — Assessment & Plan Note (Addendum)
 Chronic, followed by nephrology Dr Thedore Mins.  Latest Cr 3.6 - discussing renal replacement therapy options.

## 2023-07-14 NOTE — Assessment & Plan Note (Signed)
 Chronic, sugars remain well controlled on current regimen - continue this.

## 2023-07-24 ENCOUNTER — Encounter: Payer: Self-pay | Admitting: Family Medicine

## 2023-08-22 ENCOUNTER — Other Ambulatory Visit: Payer: Self-pay | Admitting: Family Medicine

## 2023-09-22 ENCOUNTER — Ambulatory Visit: Payer: 59 | Admitting: Dietician

## 2023-10-10 LAB — LAB REPORT - SCANNED
Albumin, Urine POC: 918.7
Creatinine, POC: 113.5 mg/dL
EGFR: 19
Microalb Creat Ratio: 809

## 2023-10-20 ENCOUNTER — Ambulatory Visit: Admitting: Dietician

## 2023-11-17 NOTE — Progress Notes (Signed)
 Orders placed and sent scheduler.

## 2023-12-08 LAB — LAB REPORT - SCANNED: EGFR: 21

## 2023-12-19 ENCOUNTER — Other Ambulatory Visit: Payer: Self-pay | Admitting: Family Medicine

## 2023-12-19 DIAGNOSIS — E559 Vitamin D deficiency, unspecified: Secondary | ICD-10-CM

## 2023-12-19 DIAGNOSIS — E1169 Type 2 diabetes mellitus with other specified complication: Secondary | ICD-10-CM

## 2023-12-19 NOTE — Telephone Encounter (Signed)
 Ozempic  Last filled:  10/02/23, #9 mL Last OV:  07/14/23, 3/ mo HTN f/u Next OV:  12/22/23, CPE

## 2023-12-22 ENCOUNTER — Ambulatory Visit (INDEPENDENT_AMBULATORY_CARE_PROVIDER_SITE_OTHER): Admitting: Family Medicine

## 2023-12-22 ENCOUNTER — Encounter: Payer: Self-pay | Admitting: Family Medicine

## 2023-12-22 VITALS — BP 138/80 | HR 92 | Temp 99.0°F | Ht 70.5 in | Wt 276.0 lb

## 2023-12-22 DIAGNOSIS — E1122 Type 2 diabetes mellitus with diabetic chronic kidney disease: Secondary | ICD-10-CM

## 2023-12-22 DIAGNOSIS — E1169 Type 2 diabetes mellitus with other specified complication: Secondary | ICD-10-CM

## 2023-12-22 DIAGNOSIS — E118 Type 2 diabetes mellitus with unspecified complications: Secondary | ICD-10-CM

## 2023-12-22 DIAGNOSIS — E785 Hyperlipidemia, unspecified: Secondary | ICD-10-CM | POA: Diagnosis not present

## 2023-12-22 DIAGNOSIS — I10 Essential (primary) hypertension: Secondary | ICD-10-CM

## 2023-12-22 DIAGNOSIS — N529 Male erectile dysfunction, unspecified: Secondary | ICD-10-CM

## 2023-12-22 DIAGNOSIS — Z125 Encounter for screening for malignant neoplasm of prostate: Secondary | ICD-10-CM | POA: Diagnosis not present

## 2023-12-22 DIAGNOSIS — Z0001 Encounter for general adult medical examination with abnormal findings: Secondary | ICD-10-CM | POA: Diagnosis not present

## 2023-12-22 DIAGNOSIS — Z1211 Encounter for screening for malignant neoplasm of colon: Secondary | ICD-10-CM

## 2023-12-22 DIAGNOSIS — E113313 Type 2 diabetes mellitus with moderate nonproliferative diabetic retinopathy with macular edema, bilateral: Secondary | ICD-10-CM

## 2023-12-22 DIAGNOSIS — D631 Anemia in chronic kidney disease: Secondary | ICD-10-CM

## 2023-12-22 DIAGNOSIS — Z7985 Long-term (current) use of injectable non-insulin antidiabetic drugs: Secondary | ICD-10-CM

## 2023-12-22 DIAGNOSIS — N184 Chronic kidney disease, stage 4 (severe): Secondary | ICD-10-CM

## 2023-12-22 DIAGNOSIS — Z6839 Body mass index (BMI) 39.0-39.9, adult: Secondary | ICD-10-CM

## 2023-12-22 DIAGNOSIS — N2581 Secondary hyperparathyroidism of renal origin: Secondary | ICD-10-CM

## 2023-12-22 DIAGNOSIS — E559 Vitamin D deficiency, unspecified: Secondary | ICD-10-CM

## 2023-12-22 DIAGNOSIS — Z7984 Long term (current) use of oral hypoglycemic drugs: Secondary | ICD-10-CM

## 2023-12-22 LAB — LIPID PANEL
Cholesterol: 130 mg/dL (ref 0–200)
HDL: 28.3 mg/dL — ABNORMAL LOW (ref >39.00)
LDL Cholesterol: 76 mg/dL (ref 0–99)
NonHDL: 101.59
Total CHOL/HDL Ratio: 5
Triglycerides: 126 mg/dL (ref 0.0–149.0)
VLDL: 25.2 mg/dL (ref 0.0–40.0)

## 2023-12-22 LAB — PSA: PSA: 0.89 ng/mL (ref 0.10–4.00)

## 2023-12-22 LAB — HEMOGLOBIN A1C: Hgb A1c MFr Bld: 6.2 % (ref 4.6–6.5)

## 2023-12-22 MED ORDER — LOSARTAN POTASSIUM 100 MG PO TABS: 100.0000 mg | ORAL_TABLET | Freq: Every day | ORAL | 3 refills | Status: AC

## 2023-12-22 MED ORDER — CARVEDILOL 6.25 MG PO TABS: 6.2500 mg | ORAL_TABLET | Freq: Two times a day (BID) | ORAL | 3 refills | Status: AC

## 2023-12-22 MED ORDER — VITAMIN D3 1.25 MG (50000 UT) PO CAPS: 50000.0000 [IU] | ORAL_CAPSULE | ORAL | 3 refills | Status: AC

## 2023-12-22 MED ORDER — METFORMIN HCL ER 500 MG PO TB24: 500.0000 mg | ORAL_TABLET | Freq: Every day | ORAL | 3 refills | Status: AC

## 2023-12-22 MED ORDER — AMLODIPINE BESYLATE 10 MG PO TABS
10.0000 mg | ORAL_TABLET | Freq: Every day | ORAL | 3 refills | Status: AC
Start: 2023-12-22 — End: ?

## 2023-12-22 MED ORDER — SEMAGLUTIDE (1 MG/DOSE) 4 MG/3ML ~~LOC~~ SOPN: 1.0000 mg | PEN_INJECTOR | SUBCUTANEOUS | 3 refills | Status: AC

## 2023-12-22 MED ORDER — SILDENAFIL CITRATE 100 MG PO TABS
50.0000 mg | ORAL_TABLET | Freq: Every day | ORAL | 11 refills | Status: AC | PRN
Start: 2023-12-22 — End: ?

## 2023-12-22 MED ORDER — ATORVASTATIN CALCIUM 40 MG PO TABS
40.0000 mg | ORAL_TABLET | Freq: Every day | ORAL | 3 refills | Status: AC
Start: 2023-12-22 — End: ?

## 2023-12-22 NOTE — Assessment & Plan Note (Signed)
 Will request latest diabetic eye exam from [facility].

## 2023-12-22 NOTE — Patient Instructions (Addendum)
 We will request latest diabetic eye exam from [facility].  Labs today  We will refer you for colonoscopy in [place].  You may call [facility] to schedule an appointment at [phone].   Good to see you today Return as needed or in 6 months for diabetes follow up visit

## 2023-12-22 NOTE — Progress Notes (Signed)
 Ph: [phone] Fax: [phone]   Patient ID: Jason Whitney, male    DOB: 02-Oct-1975, 48 y.o.   MRN: [id number]  This visit was conducted in person.  BP 138/80   Pulse 92   Temp 99 F (37.2 C) (Oral)   Ht 5' 10.5 (1.791 m)   Wt 276 lb (125.2 kg)   SpO2 98%   BMI 39.04 kg/m    CC: CPE Subjective:   HPI: Jason Whitney is a 48 y.o. 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  Continued slow weight loss on ozempic  1mg  weekly.   Labs from last month with renal reviewed - Cr 3.56, eGFR 21, Hgb 11.7, PTH 86.  Lab Results  Component Value Date   HGBA1C 6.8 (A) 06/23/2023     Preventative: No fmhx prostate or colon cancer  Colon cancer screening - discussed options, requests colonoscopy - referral placed Prostate cancer screening - check PSA today.  Flu shot - declined COVID vaccine - declined Pneumovax 11/2012 Tdap 2012, declines rpt  Seat belt use discussed. Sunscreen use discussed. No changing moles on skin.  Non smoker Alcohol - none Dentist - q6 mo - has had dental work this past year  Eye exam yearly last seen . Sees [facility], receiving eye shots.   Lives with girlfriend and daughter (2006); no pets  12th grade education  Occ: truck Hospital doctor for [employer]  Activity: no regular exercise  Diet: good water, fruits/vegetables daily      Relevant past medical, surgical, family and social history reviewed and updated as indicated. Interim medical history since our last visit reviewed. Allergies and medications reviewed and updated. Outpatient Medications Prior to Visit  Medication Sig Dispense Refill   Blood Glucose Monitoring Suppl (ONE TOUCH ULTRA MINI) w/Device KIT Use as directed 1 kit 0   glucose  blood (ONE TOUCH ULTRA TEST) test strip CHECK TWICE DAILY 200 each 3   JARDIANCE 10 MG TABS tablet Take 10 mg by mouth daily.     KERENDIA 10 MG TABS Take 1 tablet by mouth daily.     amLODipine  (NORVASC ) 10 MG tablet TAKE 1 TABLET BY MOUTH EVERY DAY 90 tablet 1   atorvastatin  (LIPITOR) 40 MG tablet Take 1 tablet (40 mg total) by mouth daily. 90 tablet 4   carvedilol  (COREG ) 6.25 MG tablet Take 1 tablet (6.25 mg total) by mouth 2 (two) times daily. 180 tablet 4   Cholecalciferol (VITAMIN D3) 1.25 MG (50000 UT) CAPS TAKE 1 CAPSULE BY MOUTH ONE TIME PER WEEK 12 capsule 0   losartan  (COZAAR ) 100 MG tablet Take 1 tablet (100 mg total) by mouth daily. 90 tablet 4   metFORMIN  (GLUCOPHAGE -XR) 500 MG 24 hr tablet Take 1 tablet (500 mg total) by mouth daily with breakfast. 90 tablet 4   Semaglutide , 1 MG/DOSE, 4 MG/3ML SOPN Inject 1 mg as directed once a week. 9 mL 4   sildenafil  (VIAGRA ) 100 MG tablet TAKE 1/2 TO 1 TABLET (50-100 MG TOTAL) BY MOUTH AS NEEDED FOR ERECTILE DYSFUNCTION 5 tablet 7   No facility-administered medications prior to visit.     Per HPI unless specifically indicated in ROS section below Review of Systems  Constitutional:  Negative for activity change, appetite change, chills, fatigue, fever and unexpected weight change.  HENT:  Negative  for hearing loss.   Eyes:  Negative for visual disturbance.  Respiratory:  Negative for cough, chest tightness, shortness of breath and wheezing.   Cardiovascular:  Negative for chest pain, palpitations and leg swelling.  Gastrointestinal:  Negative for abdominal distention, abdominal pain, blood in stool, constipation, diarrhea, nausea and vomiting.  Genitourinary:  Negative for difficulty urinating and hematuria.  Musculoskeletal:  Negative for arthralgias, myalgias and neck pain.  Skin:  Negative for rash.  Neurological:  Negative for dizziness, seizures, syncope and headaches.  Hematological:  Negative for adenopathy. Does not  bruise/bleed easily.  Psychiatric/Behavioral:  Negative for dysphoric mood. The patient is not nervous/anxious.     Objective:  BP 138/80   Pulse 92   Temp 99 F (37.2 C) (Oral)   Ht 5' 10.5 (1.791 m)   Wt 276 lb (125.2 kg)   SpO2 98%   BMI 39.04 kg/m   Wt Readings from Last 3 Encounters:  12/22/23 276 lb (125.2 kg)  07/14/23 280 lb 6 oz (127.2 kg)  06/30/23 285 lb 12.8 oz (129.6 kg)      Physical Exam Vitals and nursing note reviewed.  Constitutional:      General: He is not in acute distress.    Appearance: Normal appearance. He is well-developed. He is not ill-appearing.  HENT:     Head: Normocephalic and atraumatic.     Right Ear: Hearing, tympanic membrane, ear canal and external ear normal.     Left Ear: Hearing, tympanic membrane, ear canal and external ear normal.     Mouth/Throat:     Mouth: Mucous membranes are moist.     Pharynx: Oropharynx is clear. No oropharyngeal exudate or posterior oropharyngeal erythema.  Eyes:     General: No scleral icterus.    Extraocular Movements: Extraocular movements intact.     Conjunctiva/sclera: Conjunctivae normal.     Pupils: Pupils are equal, round, and reactive to light.  Neck:     Thyroid: No thyroid mass or thyromegaly.  Cardiovascular:     Rate and Rhythm: Normal rate and regular rhythm.     Pulses: Normal pulses.          Radial pulses are 2+ on the right side and 2+ on the left side.     Heart sounds: Normal heart sounds. No murmur heard. Pulmonary:     Effort: Pulmonary effort is normal. No respiratory distress.     Breath sounds: Normal breath sounds. No wheezing, rhonchi or rales.  Abdominal:     General: Bowel sounds are normal. There is no distension.     Palpations: Abdomen is soft. There is no mass.     Tenderness: There is no abdominal tenderness. There is no guarding or rebound.     Hernia: No hernia is present.  Musculoskeletal:        General: Normal range of motion.     Cervical back: Normal range  of motion and neck supple.     Right lower leg: No edema.     Left lower leg: No edema.  Lymphadenopathy:     Cervical: No cervical adenopathy.  Skin:    General: Skin is warm and dry.     Findings: No rash.  Neurological:     General: No focal deficit present.     Mental Status: He is alert and oriented to person, place, and time.  Psychiatric:        Mood and Affect: Mood normal.        Behavior:  Behavior normal.        Thought Content: Thought content normal.        Judgment: Judgment normal.       Results for orders placed or performed in visit on 12/10/23  Lab report - scanned   Collection Time: 12/08/23 12:50 PM  Result Value Ref Range   EGFR 21.0     Assessment & Plan:   Problem List Items Addressed This Visit     Encounter for general adult medical examination with abnormal findings - Primary (Chronic)   Preventative protocols reviewed and updated unless pt declined. Discussed healthy diet and lifestyle.       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.       Relevant Medications   atorvastatin  (LIPITOR) 40 MG tablet   losartan  (COZAAR ) 100 MG tablet   metFORMIN  (GLUCOPHAGE -XR) 500 MG 24 hr tablet   Semaglutide , 1 MG/DOSE, 4 MG/3ML SOPN   Hyperlipidemia associated with type 2 diabetes mellitus (HCC)   Chronic, update FLP on atorva. The 10-year ASCVD risk score (Arnett DK, et al., 2019) is: 16.5%   Values used to calculate the score:     Age: 43 years     Clincally relevant sex: Male     Is Non-Hispanic African American: Yes     Diabetic: Yes     Tobacco smoker: No     Systolic Blood Pressure: 138 mmHg     Is BP treated: Yes     HDL Cholesterol: 32.4 mg/dL     Total Cholesterol: 210 mg/dL       Relevant Medications   amLODipine  (NORVASC ) 10 MG tablet    atorvastatin  (LIPITOR) 40 MG tablet   carvedilol  (COREG ) 6.25 MG tablet   losartan  (COZAAR ) 100 MG tablet   metFORMIN  (GLUCOPHAGE -XR) 500 MG 24 hr tablet   Semaglutide , 1 MG/DOSE, 4 MG/3ML SOPN   sildenafil  (VIAGRA ) 100 MG tablet   Other Relevant Orders   Lipid panel   Severe obesity (BMI 35.0-39.9) with comorbidity (HCC)   Continue to encourage healthy diet and lifestyle choices to effect sustainable weight loss.       Relevant Medications   metFORMIN  (GLUCOPHAGE -XR) 500 MG 24 hr tablet   Semaglutide , 1 MG/DOSE, 4 MG/3ML SOPN   Hypertension   Chronic, stable. Continue current regimen.       Relevant Medications   amLODipine  (NORVASC ) 10 MG tablet   atorvastatin  (LIPITOR) 40 MG tablet   carvedilol  (COREG ) 6.25 MG tablet   losartan  (COZAAR ) 100 MG tablet   sildenafil  (VIAGRA ) 100 MG tablet   Vitamin D  deficiency   Continue weekly vitamin D  replacement.       Relevant Medications   Cholecalciferol (VITAMIN D3) 1.25 MG (50000 UT) CAPS   Type 2 diabetes mellitus with other specified complication (HCC)   Chronic, update A1c and fructosamine levels       Relevant Medications   atorvastatin  (LIPITOR) 40 MG tablet   losartan  (COZAAR ) 100 MG tablet   metFORMIN  (GLUCOPHAGE -XR) 500 MG 24 hr tablet   Semaglutide , 1 MG/DOSE, 4 MG/3ML SOPN   CKD stage 4 due to type 2 diabetes mellitus (HCC)   Chronic, appreciate nephrology care.  Discussing kidney transplant candidacy.       Relevant Medications   atorvastatin  (LIPITOR) 40 MG tablet   losartan  (COZAAR ) 100 MG  tablet   metFORMIN  (GLUCOPHAGE -XR) 500 MG 24 hr tablet   Semaglutide , 1 MG/DOSE, 4 MG/3ML SOPN   Secondary hyperparathyroidism of renal origin (HCC)   Anemia due to stage 4 chronic kidney disease (HCC)   Recent labs stable with Hgb 11.7.       Erectile dysfunction   Viagra  refilled      Relevant Medications   sildenafil  (VIAGRA ) 100 MG tablet   Other Visit Diagnoses       Type II diabetes mellitus with  complication (HCC)       Relevant Medications   atorvastatin  (LIPITOR) 40 MG tablet   losartan  (COZAAR ) 100 MG tablet   metFORMIN  (GLUCOPHAGE -XR) 500 MG 24 hr tablet   Semaglutide , 1 MG/DOSE, 4 MG/3ML SOPN   Other Relevant Orders   Hemoglobin A1c   Fructosamine     Special screening for malignant neoplasm of prostate       Relevant Orders   PSA     Special screening for malignant neoplasms, colon       Relevant Orders   Ambulatory referral to Gastroenterology        Meds ordered this encounter  Medications   amLODipine  (NORVASC ) 10 MG tablet    Sig: Take 1 tablet (10 mg total) by mouth daily.    Dispense:  90 tablet    Refill:  3   atorvastatin  (LIPITOR) 40 MG tablet    Sig: Take 1 tablet (40 mg total) by mouth daily.    Dispense:  90 tablet    Refill:  3   carvedilol  (COREG ) 6.25 MG tablet    Sig: Take 1 tablet (6.25 mg total) by mouth 2 (two) times daily.    Dispense:  180 tablet    Refill:  3   Cholecalciferol (VITAMIN D3) 1.25 MG (50000 UT) CAPS    Sig: Take 1 capsule (1.25 mg total) by mouth once a week.    Dispense:  12 capsule    Refill:  3   losartan  (COZAAR ) 100 MG tablet    Sig: Take 1 tablet (100 mg total) by mouth daily.    Dispense:  90 tablet    Refill:  3   metFORMIN  (GLUCOPHAGE -XR) 500 MG 24 hr tablet    Sig: Take 1 tablet (500 mg total) by mouth daily with breakfast.    Dispense:  90 tablet    Refill:  3   Semaglutide , 1 MG/DOSE, 4 MG/3ML SOPN    Sig: Inject 1 mg as directed once a week.    Dispense:  9 mL    Refill:  3   sildenafil  (VIAGRA ) 100 MG tablet    Sig: Take 0.5-1 tablets (50-100 mg total) by mouth daily as needed for erectile dysfunction.    Dispense:  5 tablet    Refill:  11    Orders Placed This Encounter  Procedures   Hemoglobin A1c   Lipid panel   PSA   Fructosamine   Ambulatory referral to Gastroenterology    Referral Priority:   Routine    Referral Type:   Consultation    Referral Reason:   Specialty Services Required     Number of Visits Requested:   1    Patient Instructions  We will request latest diabetic eye exam from [facility].  Labs today  We will refer you for colonoscopy in [place].  You may call [facility] to schedule an appointment at [phone].   Good to see you today Return as needed or in  6 months for diabetes follow up visit   Follow up plan: Return in about 6 months (around 06/23/2024) for follow up visit.  Anton Blas, MD

## 2023-12-22 NOTE — Assessment & Plan Note (Signed)
 Chronic, update A1c and fructosamine levels

## 2023-12-22 NOTE — Assessment & Plan Note (Signed)
 Continue to encourage healthy diet and lifestyle choices to effect sustainable weight loss.

## 2023-12-22 NOTE — Assessment & Plan Note (Signed)
 Chronic, appreciate nephrology care.  Discussing kidney transplant candidacy.

## 2023-12-22 NOTE — Assessment & Plan Note (Signed)
 Viagra  refilled.

## 2023-12-22 NOTE — Assessment & Plan Note (Signed)
 Continue weekly vitamin D  replacement.

## 2023-12-22 NOTE — Assessment & Plan Note (Signed)
 Preventative protocols reviewed and updated unless pt declined. Discussed healthy diet and lifestyle.

## 2023-12-22 NOTE — Assessment & Plan Note (Signed)
 Chronic, update FLP on atorva. The 10-year ASCVD risk score (Arnett DK, et al., 2019) is: 16.5%   Values used to calculate the score:     Age: 48 years     Clincally relevant sex: Male     Is Non-Hispanic African American: Yes     Diabetic: Yes     Tobacco smoker: No     Systolic Blood Pressure: 138 mmHg     Is BP treated: Yes     HDL Cholesterol: 32.4 mg/dL     Total Cholesterol: 210 mg/dL

## 2023-12-22 NOTE — Assessment & Plan Note (Signed)
 Chronic, stable. Continue current regimen.

## 2023-12-22 NOTE — Assessment & Plan Note (Signed)
 Recent labs stable with Hgb 11.7.

## 2023-12-25 LAB — FRUCTOSAMINE: Fructosamine: 274 umol/L (ref 205–285)

## 2023-12-28 ENCOUNTER — Ambulatory Visit: Payer: Self-pay | Admitting: Family Medicine

## 2024-02-05 ENCOUNTER — Encounter: Payer: Self-pay | Admitting: Internal Medicine

## 2024-02-23 ENCOUNTER — Ambulatory Visit

## 2024-02-23 VITALS — Ht 70.5 in | Wt 280.2 lb

## 2024-02-23 DIAGNOSIS — Z1211 Encounter for screening for malignant neoplasm of colon: Secondary | ICD-10-CM

## 2024-02-23 MED ORDER — NA SULFATE-K SULFATE-MG SULF 17.5-3.13-1.6 GM/177ML PO SOLN: 1.0000 | Freq: Once | ORAL | 0 refills | Status: AC

## 2024-02-23 NOTE — Progress Notes (Signed)

## 2024-02-24 ENCOUNTER — Encounter: Payer: Self-pay | Admitting: Internal Medicine

## 2024-03-01 ENCOUNTER — Encounter: Payer: Self-pay | Admitting: Internal Medicine

## 2024-03-01 ENCOUNTER — Ambulatory Visit (AMBULATORY_SURGERY_CENTER): Admitting: Internal Medicine

## 2024-03-01 VITALS — BP 125/75 | HR 75 | Temp 97.9°F | Resp 11 | Ht 70.5 in | Wt 280.0 lb

## 2024-03-01 DIAGNOSIS — N189 Chronic kidney disease, unspecified: Secondary | ICD-10-CM

## 2024-03-01 DIAGNOSIS — K5730 Diverticulosis of large intestine without perforation or abscess without bleeding: Secondary | ICD-10-CM

## 2024-03-01 DIAGNOSIS — Z1211 Encounter for screening for malignant neoplasm of colon: Secondary | ICD-10-CM | POA: Diagnosis present

## 2024-03-01 HISTORY — DX: Chronic kidney disease, unspecified: N18.9

## 2024-03-01 MED ORDER — SODIUM CHLORIDE 0.9 % IV SOLN: 500.0000 mL | Freq: Once | INTRAVENOUS | Status: DC

## 2024-03-01 NOTE — Progress Notes (Signed)
 HISTORY OF PRESENT ILLNESS:  Jason Whitney is a 48 y.o. male that directly for screening colonoscopy.  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 No pertinent surgical history.  Social History Jason Whitney  reports that he has never smoked. He has never used smokeless tobacco. He reports current alcohol use. He reports that he does not use drugs.  family history includes Diabetes in his maternal grandmother and mother; Stroke in his maternal grandmother.  No Known Allergies     PHYSICAL EXAMINATION: Vital signs: BP (!) 144/104   Pulse 78   Temp 97.9 F (36.6 C) (Temporal)   Ht 5' 10.5 (1.791 m)   Wt 280 lb (127 kg)   SpO2 98%   BMI 39.61 kg/m  General: Well-developed, well-nourished, no acute distress HEENT: Sclerae are anicteric, conjunctiva pink. Oral mucosa intact Lungs: Clear Heart: Regular Abdomen: soft, nontender, nondistended, no obvious ascites, no peritoneal signs, normal bowel sounds. No organomegaly. Extremities: No edema Psychiatric: alert and oriented x3. Cooperative     ASSESSMENT:  Colon cancer screening   PLAN:   Screening colonoscopy

## 2024-03-01 NOTE — Patient Instructions (Signed)
  Resume previous diet  Continue present medications  Repeat colonoscopy in 10 years for screening purposes   YOU HAD AN ENDOSCOPIC PROCEDURE TODAY AT THE [facility]:   Refer to the procedure report that was given to you for any specific questions about what was found during the examination.  If the procedure report does not answer your questions, please call your gastroenterologist to clarify.  If you requested that your care partner not be given the details of your procedure findings, then the procedure report has been included in a sealed envelope for you to review at your convenience later.  YOU SHOULD EXPECT: Some feelings of bloating in the abdomen. Passage of more gas than usual.  Walking can help get rid of the air that was put into your GI tract during the procedure and reduce the bloating. If you had a lower endoscopy (such as a colonoscopy or flexible sigmoidoscopy) you may notice spotting of blood in your stool or on the toilet paper. If you underwent a bowel prep for your procedure, you may not have a normal bowel movement for a few days.  Please Note:  You might notice some irritation and congestion in your nose or some drainage.  This is from the oxygen used during your procedure.  There is no need for concern and it should clear up in a day or so.  SYMPTOMS TO REPORT IMMEDIATELY:  Following lower endoscopy (colonoscopy or flexible sigmoidoscopy):  Excessive amounts of blood in the stool  Significant tenderness or worsening of abdominal pains  Swelling of the abdomen that is new, acute  Fever of 100F or higher  For urgent or emergent issues, a gastroenterologist can be reached at any hour by calling [phone]. Do not use MyChart messaging for urgent concerns.    DIET:  We do recommend a small meal at first, but then you may proceed to your regular diet.  Drink plenty of fluids but you should avoid alcoholic beverages for 24 hours.  ACTIVITY:  You should  plan to take it easy for the rest of today and you should NOT DRIVE or use heavy machinery until tomorrow (because of the sedation medicines used during the test).    FOLLOW UP: Our staff will call the number listed on your records the next business day following your procedure.  We will call around 7:15- 8:00 am to check on you and address any questions or concerns that you may have regarding the information given to you following your procedure. If we do not reach you, we will leave a message.     If any biopsies were taken you will be contacted by phone or by letter within the next 1-3 weeks.  Please call us at [phone] if you have not heard about the biopsies in 3 weeks.    SIGNATURES/CONFIDENTIALITY: You and/or your care partner have signed paperwork which will be entered into your electronic medical record.  These signatures attest to the fact that that the information above on your After Visit Summary has been reviewed and is understood.  Full responsibility of the confidentiality of this discharge information lies with you and/or your care-partner.

## 2024-03-01 NOTE — Op Note (Signed)
 [facility] Patient Name: Jason Whitney Procedure Date: 03/01/2024 2:25 PM MRN: [id number] Endoscopist: Norleen SAILOR. Abran , MD, [id number] Age: 48 Referring MD:  Date of Birth: 12-07-1975 Gender: Male Account #: [id number] Procedure:                Colonoscopy Indications:              Screening for colorectal malignant neoplasm Medicines:                Monitored Anesthesia Care Procedure:                Pre-Anesthesia Assessment:                           - Prior to the procedure, a History and Physical                            was performed, and patient medications and                            allergies were reviewed. The patient's tolerance of                            previous anesthesia was also reviewed. The risks                            and benefits of the procedure and the sedation                            options and risks were discussed with the patient.                            All questions were answered, and informed consent                            was obtained. Prior Anticoagulants: The patient has                            taken no anticoagulant or antiplatelet agents. ASA                            Grade Assessment: III - A patient with severe                            systemic disease. After reviewing the risks and                            benefits, the patient was deemed in satisfactory                            condition to undergo the procedure.                           After obtaining informed consent, the colonoscope  was passed under direct vision. Throughout the                            procedure, the patient's blood pressure, pulse, and                            oxygen saturations were monitored continuously. The                            Olympus Scope SN: [id number] was introduced through                            the anus and advanced to the the cecum, identified                            by  appendiceal orifice and ileocecal valve. The                            ileocecal valve, appendiceal orifice, and rectum                            were photographed. The quality of the bowel                            preparation was excellent. The colonoscopy was                            performed without difficulty. The patient tolerated                            the procedure well. The bowel preparation used was                            SUPREP via split dose instruction. Scope In: 2:35:30 PM Scope Out: 2:46:40 PM Scope Withdrawal Time: 0 hours 9 minutes 16 seconds  Total Procedure Duration: 0 hours 11 minutes 10 seconds  Findings:                 A few small-mouthed diverticula were found in the                            sigmoid colon.                           The exam was otherwise without abnormality on                            direct and retroflexion views. Complications:            No immediate complications. Estimated blood loss:                            None. Estimated Blood Loss:     Estimated blood loss: none. Impression:               - Diverticulosis in  the sigmoid colon.                           - The examination was otherwise normal on direct                            and retroflexion views.                           - No specimens collected. Recommendation:           - Repeat colonoscopy in 10 years for screening                            purposes.                           - Patient has a contact number available for                            emergencies. The signs and symptoms of potential                            delayed complications were discussed with the                            patient. Return to normal activities tomorrow.                            Written discharge instructions were provided to the                            patient.                           - Resume previous diet.                           - Continue present  medications. Norleen SAILOR. Abran, MD 03/01/2024 2:51:11 PM This report has been signed electronically.

## 2024-03-01 NOTE — Progress Notes (Signed)
 Pt's states no medical or surgical changes since previsit or office visit.

## 2024-03-01 NOTE — Progress Notes (Signed)
 Transferred to PACU via stretcher, arousing, VSS.

## 2024-03-02 ENCOUNTER — Telehealth: Payer: Self-pay

## 2024-03-02 NOTE — Telephone Encounter (Signed)
  Follow up Call-     03/01/2024    1:49 PM  Call back number  Post procedure Call Back phone  # [phone]  Permission to leave phone message Yes     Patient questions:  Do you have a fever, pain , or abdominal swelling? No. Pain Score  0 *  Have you tolerated food without any problems? Yes.    Have you been able to return to your normal activities? Yes.    Do you have any questions about your discharge instructions: Diet   No. Medications  No. Follow up visit  No.  Do you have questions or concerns about your Care? No.  Actions: * If pain score is 4 or above: No action needed, pain <4.

## 2024-03-12 NOTE — Progress Notes (Addendum)
 Subjective Patient ID: Jason Whitney is a 48 y.o. male.    Kiing Deakin is a 48 y.o. male w/ hx of HTN, DM, CKD IV who presents to the clinic for evaluation of LEFT foot pain onset 03-10-24. Reports to 7/10 (0-10) pain. Notes that he had stepped on the foot wrong and felt pain in the MTP joint of great toe. Patient reports that he has had Uric acid level drawn and it was normal. Pt requesting X-ray.    History provided by:  Patient Language interpreter used: No     Review of Systems  Constitutional:  Negative for chills and fever.  Musculoskeletal:  Positive for arthralgias and joint swelling.  Skin:  Negative for color change and wound.  Neurological:  Negative for weakness and numbness.    Patient History  Allergies: No Known Allergies  Past Medical History:  Diagnosis Date  . CKD (chronic kidney disease), stage IV (CMS/HCC)   . Diabetes mellitus   . Hypertension    History reviewed. No pertinent surgical history. Social History   Socioeconomic History  . Marital status: Married    Spouse name: Not on file  . Number of children: Not on file  . Years of education: Not on file  . Highest education level: Not on file  Occupational History  . Not on file  Tobacco Use  . Smoking status: Never  . Smokeless tobacco: Never  Substance and Sexual Activity  . Alcohol use: Not on file  . Drug use: Not on file  . Sexual activity: Not on file  Other Topics Concern  . Not on file  Social History Narrative  . Not on file   History reviewed. No pertinent family history. Current Outpatient Medications on File Prior to Visit  Medication Sig Dispense Refill  . amLODIPine  (Norvasc ) 10 MG tablet Take 10 mg by mouth 1 (one) time each day.    . atorvastatin  (Lipitor) 40 MG tablet Take 40 mg by mouth 1 (one) time each day.    . carvedilol  (Coreg ) 6.25 MG tablet Take 6.25 mg by mouth in the morning and 6.25 mg before bedtime.    . Cholecalciferol (Vitamin D3) 1.25 MG (50000 UT)  capsule TAKE 1 CAPSULE BY MOUTH ONE TIME PER WEEK    . ergocalciferol  (Vitamin D2) 1.25 MG (50000 UT) capsule Take 50,000 Units by mouth every 7 (seven) days.    . Jardiance 10 MG Take 10 mg by mouth 1 (one) time each day.    SABRA Kerendia 10 MG tablet Take 1 tablet by mouth 1 (one) time each day.    . losartan  (Cozaar ) 100 MG tablet Take 100 mg by mouth 1 (one) time each day.    . metFORMIN  XR (Glucophage -XR) 500 MG 24 hr tablet Take 500 mg by mouth 1 (one) time each day with dinner.    . Semaglutide , 1 MG/DOSE, 4 MG/3ML solution pen-injector Infuse 1 mg into a venous catheter.    . sildenafil  (Viagra ) 100 MG tablet Take 100 mg by mouth if needed.     No current facility-administered medications on file prior to visit.    Objective  Vitals:   03/12/24 0825  BP: 123/72  Pulse: 82  Resp: 18  Temp: 37 C (98.6 F)  TempSrc: Tympanic  SpO2: 99%  Weight: 13.1 kg  Height: 5' 11  PainSc:   7               No results found.  Physical Exam Vitals and nursing  note reviewed.  Constitutional:      General: He is not in acute distress.    Appearance: Normal appearance. He is not ill-appearing, toxic-appearing or diaphoretic.  HENT:     Head: Normocephalic and atraumatic.  Eyes:     Conjunctiva/sclera: Conjunctivae normal.  Pulmonary:     Effort: Pulmonary effort is normal. No respiratory distress.  Musculoskeletal:     Cervical back: Normal range of motion and neck supple. No rigidity.     Left ankle: No swelling or ecchymosis. No tenderness. Normal range of motion.     Left Achilles Tendon: No tenderness or defects.     Left foot: Normal range of motion and normal capillary refill. Swelling, tenderness and bony tenderness present. No laceration or crepitus.       Feet:  Feet:     Left foot:     Skin integrity: No ulcer, blister, skin breakdown, erythema, warmth, callus, dry skin or fissure.  Skin:    General: Skin is dry.     Coloration: Skin is not jaundiced or pale.   Neurological:     Mental Status: He is alert.     Gait: Gait abnormal (mild antalgic gait).  Psychiatric:        Mood and Affect: Mood normal.        Behavior: Behavior normal.     Results for orders placed or performed in visit on 03/12/24  XR toes 2+ views left   Narrative   Preliminary Read:  No acute fracture.  No dislocations.  LEFT FOOT/TOES FILMS  COMPARISON: None  FINDINGS:  3 views of the left foot/first digit were obtained.. There is no acute fracture. Soft tissues are unremarkable. Articular surfaces are maintained. Bones are normally mineralized.    Impression   No acute bony abnormality     If pain persists or occult fracture remains clinically suspected, then delayed radiographs in 7-10 days may be of benefit.  Electronically signed by: David Zamos M.D. on 03/12/2024 08:55:42       Procedures MDM:     1+ Chronic illness with exacerbation, progression or side effects of treatment     Explanation of Medical Decision Making and variances from expected care:    Patient presents with concern for acute gout flare. Recent labs show elevated uric acid level, consistent with diagnosis. Patient has a history of chronic kidney disease, making NSAIDs contraindicated for management.  Given contraindication to NSAIDs and no indication for NSAIDs/ colchicine at this time, a low-dose prednisone  taper was initiated for anti-inflammatory control. Patient was advised on monitoring blood glucose closely during steroid use secondary to hx of DM.     Assessment requiring historian other than patient: No     Independent visualization of image, tracing, or test: No     Discussion of management with another provider: No     Risk:: Moderate            Assessment/Plan Diagnoses and all orders for this visit:  Gouty arthritis of left foot -     predniSONE  (Deltasone ) 10 MG tablet; Take 3 tablets (30 mg total) by mouth 1 (one) time each day for 3 days, THEN 2 tablets  (20 mg total) 1 (one) time each day for 4 days, THEN 1 tablet (10 mg total) 1 (one) time each day for 3 days.  Great toe pain, left -     XR toes 2+ views left    Disposition Status: Home  Patient Instructions  Discharge Instructions -  Gout  Gout is a type of arthritis caused by too much uric acid building up in the blood. When the uric acid levels get too high, it can form sharp crystals that settle in the joints, leading to sudden and severe pain, swelling, redness, and tenderness.  Cause: Uric acid is a natural waste product that forms when your body breaks down purines, which are found in your body and in certain foods. Normally, uric acid dissolves in the blood and is filtered out by the kidneys. If your body makes too much or doesn't get rid of enough, it builds up.  Common joints affected: . Big toe (classic spot) . Ankles . Knees . Elbows . Wrists and fingers  What a gout attack feels like: .Sudden, intense pain, often at night .Swelling and warmth in the joint .Red or shiny skin over the area . Feels like the joint is "on fire" or being crushed  Risk factors include: . Eating lots of red meat or seafood . Drinking alcohol (especially beer) . Sugary drinks (like soda) . Obesity . Certain medications (like thiazide diuretics), large doses of Aspirin  . Family history of gout . Kidney problems  Medications: You have been prescribed one of the following medications to manage your gout:  .Steroids (e.g., prednisone ) - if NSAIDs/colchicine are not appropriate  Important: Take medications exactly as prescribed. Do not stop or start uric acid-lowering medications during a flare unless instructed by your provider.  Activity: Rest the affected joint as much as possible. Elevate and apply cold packs to reduce swelling during the acute phase. Gradually resume normal activities as pain improves.  Diet and Lifestyle: Drink plenty of water to help flush uric  acid.  What foods should be limited or avoided?  Do not eat foods that are high in purine. Avoid these foods: Red meats like steak and hamburgers Organ meat like kidney, liver, or brains [place] [employer] like rabbit, venison, quail, pheasant, and goose Bacon, anchovies, sardines, or caviar Seafood and shellfish like shrimp, lobsters, mackerel, sardines, mussels, tuna, trout, codfish, haddock, herring, or scallops Beer, wine, and mixed drinks (alcohol) Dried beans and peas Some vegetables, such as asparagus, mushrooms, spinach, and cauliflower Gravy  Avoid high-fructose foods such as: Sweetened drinks and juices Foods with added fructose corn syrup like sodas, enriched fruit drinks, cereals, ice creams, and candy  Maintain a healthy weight; gradual weight loss can reduce flare risk.  What foods are good to eat?  It is important to eat a healthy diet. Your doctor may talk with you about a low-purine diet. These are good foods to eat: Low-fat dairy products Fresh fruits and vegetables Nuts Whole grains  Follow-Up Care: Schedule a follow-up appointment with your primary care provider or a rheumatologist within [1-2] weeks. Consider discussing long-term uric acid-lowering therapy if flares are frequent or severe.  When to Seek Medical Attention: Call your doctor or go to the ER if you experience: .Fever with joint swelling (could indicate infection) .Worsening or spreading redness/swelling .Difficulty walking or moving the joint .No relief from prescribed medications   When taking new medication monitor for any allergy symptoms or adverse effects. True IgE mediated reactions occur within 25-30 minutes of taking a new medication.    If any symptoms of generalized hives/itching accompanied with shortness of breath, wheezing, chest pain, nausea/vomiting, rapid heart rate, low blood pressure, dizziness, or lip/tongue/throat/facial swelling patient is to call 911 or go to the ED for immediate  care for severe allergy symptoms.   ??  Urgent Care Visit Summary You have been evaluated and treated today in an urgent care setting. Please note that urgent care visits are not a substitute for ongoing care with your primary care provider (PCP).  If you have a PCP, we encourage you to keep any scheduled follow-up appointments or to contact their office to arrange appropriate follow-up care related to today's visit.  Thank you for choosing [facility]!  We appreciate the opportunity to care for you and wish you a speedy recovery.     Progress note signed by Alm Prophet, PA on 03/12/24 at 11:04 AM

## 2024-03-22 NOTE — Telephone Encounter (Signed)
 Returned pt's call. He had an inquiry but couldn't recall why he had called me. I confirmed he had completed the in-person [facility] session, both of his microsite, and that I w/b mailing his incentives to him. Pt then inquired about where he was in the process of getting listed. I mentioned for him to inquire w/his kidney coordinator, Jackolyn. I followed up the call be sending and email to him w/her contact # and copying her.

## 2024-03-28 LAB — LAB REPORT - SCANNED
Albumin, Urine POC: 345.3
Albumin/Creatinine Ratio, Urine, POC: 452
Creatinine, POC: 76.4 mg/dL
EGFR: 17

## 2024-04-10 ENCOUNTER — Ambulatory Visit: Payer: Self-pay | Admitting: Family Medicine

## 2024-04-22 ENCOUNTER — Ambulatory Visit

## 2024-04-22 DIAGNOSIS — M109 Gout, unspecified: Secondary | ICD-10-CM | POA: Diagnosis not present

## 2024-04-22 DIAGNOSIS — M2012 Hallux valgus (acquired), left foot: Secondary | ICD-10-CM | POA: Diagnosis not present

## 2024-04-22 DIAGNOSIS — M205X2 Other deformities of toe(s) (acquired), left foot: Secondary | ICD-10-CM | POA: Diagnosis not present

## 2024-04-22 MED ORDER — METHYLPREDNISOLONE 4 MG PO TBPK: ORAL_TABLET | ORAL | 0 refills | Status: DC

## 2024-04-22 NOTE — Progress Notes (Signed)
 Subjective:  Patient ID: Jason Whitney, male    DOB: 1975/06/27,  MRN: [id number]  Chief Complaint  Patient presents with   Fracture    Left foot pain. He was diagnosed with a Fracture several month back. Prednisone  has helped previously. Alc 6.3    Discussed the use of AI scribe software for clinical note transcription with the patient, who gave verbal consent to proceed.  History of Present Illness Jason Whitney is a 48 year old male with diabetes who presents with recurrent big toe pain.  He has had big toe pain for several months, starting the day after tripping over a stump about 6 to 7 months ago. Patient states that x-ray at that time showed a fracture and a course of prednisone  relieved the pain. Independent chart review shows gout diagnosis.   He has had recurrent pain after minor tweaks of the toe getting out of bed or his truck, each time improving with prednisone . He tweaked the toe again on Tuesday with return of pain. Pain worsens with pressure such as from shoes but no pain with light touch from bed sheets or other sources.  He has no burning or tingling in the foot. He wears size 13 wide steel-toed boots for work and can usually manage his symptoms in these. He has diabetes and is undergoing kidney evaluation, which limits medication options for pain and inflammation.     Review of Systems: Negative except as noted in the HPI. Denies N/V/F/Ch.  Past Medical History:  Diagnosis Date   Chronic kidney disease 03/01/2024   stage 4 kidney disease, need for transplant   COVID-19 virus infection 01/31/2020   Diabetes mellitus type II 2006   HLD (hyperlipidemia)    HTN (hypertension)    Obesity    Scrotal wall abscess 04/06/2013   Current Medications[1]  Tobacco Use History[2]  Allergies[3] Objective:   Constitutional Well developed. Well nourished. Oriented to person, place, and time.  Vascular Dorsalis pedis pulses palpable bilaterally. Posterior tibial pulses  palpable bilaterally. Capillary refill normal to all digits.  No cyanosis or clubbing noted. Pedal hair growth normal.  Neurologic Normal speech. Epicritic sensation to light touch grossly intact bilaterally. Negative tinel sign at tarsal tunnel bilaterally.   Dermatologic Skin texture and turgor are within normal limits.  No open wounds. No skin lesions.  Musculoskeletal: 5 out of 5 muscle strength all major pedal muscle groups.  Hindfoot joints range of motion within normal limits without pain.  Minor hallux abductovalgus deformity present.  Pain with palpation of medial eminence of first metatarsal head.  Minor pain with first metatarsophalangeal joint range of motion and with direct compression of phalanx against metatarsal head.  No other contributing deformities.   Radiographs: Taken and reviewed 3 weightbearing views of the left foot.  Minor pes planus flexible foot shape.  Minor narrowing of the first metatarsophalangeal joint space.  Increased intermetatarsal 1-2 angle with underlying metatarsus adductus deformity.  Distal medial neck of the first metatarsal exhibits a small cortical bone growth.  No signs of fracture. No cortical erosions consistent with infectious process.  Vascular calcifications are present.    Assessment:   1. Gout of left foot, unspecified cause, unspecified chronicity   2. Hallux abducto valgus, left   3. Hallux limitus, left      Plan:  Patient was evaluated and treated and all questions answered.  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 Pain exacerbated by pressure and certain movements, relieved by prednisone . X-ray shows minor hallux valgus deformity and moderate degenerative joint disease. Conservative management preferred initially. - Previously diagnosed with gout.  Differential includes hallux limitus/DJD, gout, HAV pain.  - Discussed OTC orthotic with Morton's extension to support the arch and reduce joint movement. - Prescribed one course of oral Medrol  Dosepak for acute pain relief. - Discussed surgical options if conservative measures fail, including first metatarsal decompression osteotomy or joint fusion.  - He will return to clinic if he does not have relief with OTC orthotic.    Prentice Ovens, DPM AACFAS Fellowship Trained Podiatric Surgeon [facility]     [1]  Current Outpatient Medications:    amLODipine  (NORVASC ) 10 MG tablet, Take 1 tablet (10 mg total) by mouth daily., Disp: 90 tablet, Rfl: 3   atorvastatin  (LIPITOR) 40 MG tablet, Take 1 tablet (40 mg total) by mouth daily., Disp: 90 tablet, Rfl: 3   Blood Glucose Monitoring Suppl (ONE TOUCH ULTRA MINI) w/Device KIT, Use as directed, Disp: 1 kit, Rfl: 0   carvedilol  (COREG ) 6.25 MG tablet, Take 1 tablet (6.25 mg total) by mouth 2 (two) times daily., Disp: 180 tablet, Rfl: 3   Cholecalciferol (VITAMIN D3) 1.25 MG (50000 UT) CAPS, Take 1 capsule (1.25 mg total) by mouth once a week., Disp: 12 capsule, Rfl: 3   glucose blood (ONE TOUCH ULTRA TEST) test strip, CHECK TWICE DAILY, Disp: 200 each, Rfl: 3   JARDIANCE 10 MG TABS tablet, Take 10 mg by mouth daily., Disp: , Rfl:    KERENDIA 10 MG TABS, Take 1 tablet by mouth daily., Disp: , Rfl:    losartan  (COZAAR ) 100 MG tablet, Take 1 tablet (100 mg total) by mouth daily., Disp: 90 tablet, Rfl: 3   metFORMIN  (GLUCOPHAGE -XR) 500 MG 24 hr tablet, Take 1 tablet (500 mg total) by mouth daily with breakfast., Disp: 90 tablet, Rfl: 3   Na Sulfate-K Sulfate-Mg Sulfate concentrate (SUPREP) 17.5-3.13-1.6 GM/177ML SOLN, SMARTSIG:1 Kit(s) By Mouth Once, Disp: , Rfl:    Semaglutide , 1 MG/DOSE, 4 MG/3ML SOPN, Inject 1 mg as directed once a week., Disp: 9 mL, Rfl: 3   sildenafil  (VIAGRA ) 100 MG tablet, Take 0.5-1 tablets (50-100 mg total) by  mouth daily as needed for erectile dysfunction., Disp: 5 tablet, Rfl: 11 [2]  Social History Tobacco Use  Smoking Status Never  Smokeless Tobacco Never  [3] No Known Allergies

## 2024-05-31 LAB — LAB REPORT - SCANNED: EGFR: 22

## 2024-06-04 ENCOUNTER — Ambulatory Visit (INDEPENDENT_AMBULATORY_CARE_PROVIDER_SITE_OTHER): Admitting: Nurse Practitioner

## 2024-06-04 ENCOUNTER — Encounter: Payer: Self-pay | Admitting: Nurse Practitioner

## 2024-06-04 ENCOUNTER — Ambulatory Visit: Payer: Self-pay

## 2024-06-04 VITALS — BP 138/82 | HR 92 | Temp 98.0°F | Ht 70.5 in | Wt 281.0 lb

## 2024-06-04 DIAGNOSIS — M79671 Pain in right foot: Secondary | ICD-10-CM | POA: Diagnosis not present

## 2024-06-04 DIAGNOSIS — M722 Plantar fascial fibromatosis: Secondary | ICD-10-CM

## 2024-06-04 MED ORDER — PREDNISONE 10 MG (21) PO TBPK: ORAL_TABLET | ORAL | 0 refills | Status: AC

## 2024-06-04 NOTE — Patient Instructions (Addendum)
 Voltaren gel  [facility] Address: [address], [place], [place] [address] Phone: [phone]

## 2024-06-04 NOTE — Progress Notes (Signed)
 "  BP 138/82   Pulse 92   Temp 98 F (36.7 C)   Ht 5' 10.5 (1.791 m)   Wt 281 lb (127.5 kg)   SpO2 98%   BMI 39.75 kg/m    Subjective:    Patient ID: Jason Whitney, male    DOB: 1975/06/05, 49 y.o.   MRN: [id number]  HPI: Jason Whitney is a 49 y.o. male  Chief Complaint  Patient presents with   Foot Pain    Pt c/o right foot pain x2 days. Pain located mostly on the bottom.    Discussed the use of AI scribe software for clinical note transcription with the patient, who gave verbal consent to proceed.  History of Present Illness Jason Whitney is a 49 year old male with type 2 diabetes and chronic kidney disease who presents with acute right foot pain.  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 - Tylenol  and Epsom salt soaks have not provided significant relief - Previously responded well to prednisone  for similar symptoms, which resolved the pain - Unable to take NSAIDs due to chronic kidney disease - Open to topical treatments such as Voltaren gel  Diabetes mellitus type 2 - Last hemoglobin A1c was 5.9  Chronic kidney disease - Recent GFR of 20 - Under care of nephrologist - Being considered for kidney transplant - Not currently on dialysis and wishes to avoid dialysis to continue working         12/22/2023    8:06 AM 06/23/2023    8:23 AM 06/07/2022   11:34 AM  Depression screen PHQ 2/9  Decreased Interest 0 0 0  Down, Depressed, Hopeless 0 0 0  PHQ - 2 Score 0 0 0    Relevant past medical, surgical, family and social history reviewed and updated as indicated. Interim medical history since our last visit reviewed. Allergies and medications reviewed and updated.  Review  of Systems  Ten systems reviewed and is negative except as mentioned in HPI      Objective:      BP 138/82   Pulse 92   Temp 98 F (36.7 C)   Ht 5' 10.5 (1.791 m)   Wt 281 lb (127.5 kg)   SpO2 98%   BMI 39.75 kg/m    Wt Readings from Last 3 Encounters:  06/04/24 281 lb (127.5 kg)  03/01/24 280 lb (127 kg)  02/23/24 280 lb 3.2 oz (127.1 kg)    Physical Exam GENERAL: Alert, cooperative, well developed, no acute distress. HEENT: Normocephalic, normal oropharynx, moist mucous membranes. CHEST: Clear to auscultation bilaterally, no wheezes, rhonchi, or crackles. CARDIOVASCULAR: Normal heart rate and rhythm, S1 and S2 normal without murmurs. ABDOMEN: Soft, non-tender, non-distended, without organomegaly, normal bowel sounds. EXTREMITIES: No cyanosis or edema. Pain in the arch, side, and top of the right foot. NEUROLOGICAL: Cranial nerves grossly intact, moves all extremities without gross motor or sensory deficit. Right foot:  tenderness to lateral and arch of right foot  Results for orders placed or performed in visit on 04/06/24  Lab report - scanned   Collection Time: 03/28/24  9:48 AM  Result Value Ref Range   Creatinine, POC 76.4  mg/dL   Albumin, Urine POC 654.3    Albumin/Creatinine Ratio, Urine, POC 452    EGFR 17.0           Assessment & Plan:   Problem List Items Addressed This Visit   None Visit Diagnoses       Right foot pain    -  Primary   Relevant Medications   predniSONE  (STERAPRED UNI-PAK 21 TAB) 10 MG (21) TBPK tablet   Other Relevant Orders   Ambulatory referral to Podiatry     Plantar fasciitis of right foot       Relevant Medications   predniSONE  (STERAPRED UNI-PAK 21 TAB) 10 MG (21) TBPK tablet   Other Relevant Orders   Ambulatory referral to Podiatry        Assessment and Plan Assessment & Plan Plantar fasciitis of right foot Acute plantar fasciitis in the right foot, presenting with severe pain in the arch, exacerbated by  weight-bearing and prolonged sitting. Pain improves slightly with walking. Likely due to inflammation of the plantar fascia. Plantar fasciitis is most likely given the location and nature of the pain. Previous episodes responded well to prednisone . - Prescribed prednisone  taper for inflammation and pain management. - Advised close monitoring of blood glucose levels due to potential hyperglycemia from steroid use. - Referred to podiatry for further evaluation and possible steroid injection. - Recommended use of a frozen water bottle for rolling under the foot to alleviate pain. - Suggested topical Voltaren gel for localized pain relief, as it is non-systemic and safe for renal function. - Advised rest and ice application to reduce inflammation.        Follow up plan: Return if symptoms worsen or fail to improve. "

## 2024-06-04 NOTE — Telephone Encounter (Signed)
 Appreciate provider seeing this nice patient.  No known h/o gout but has stage 4 CKD due to T2DM followed by nephrology discussing kidney/pancreas transplant.

## 2024-06-04 NOTE — Telephone Encounter (Signed)
 FYI Only or Action Required?: FYI only for provider: appointment scheduled on 06/04/24 at [facility].  Patient was last seen in primary care on 12/22/2023 by Rilla Baller, MD.  Called Nurse Triage reporting Foot Pain.  Symptoms began several days ago.  Interventions attempted: OTC medications: Epsom Salt Soaks, Tylenol  .  Symptoms are: stable.  Triage Disposition: See PCP When Office is Open (Within 3 Days)  Patient/caregiver understands and will follow disposition?: Yes  Reason for Disposition  [1] MODERATE pain (e.g., interferes with normal activities, limping) AND [2] present > 3 days  Answer Assessment - Initial Assessment Questions Pt's wife Crystal calling in for patient today. Tried Epsom salt soak and tylenol  with no relief. Wife requested I call pt to set up appointment as there are no openings in PCP office. Pt answered and agreeable to be seen at [facility] this afternoon.   1. ONSET: When did the pain start?      Couple of days ago  2. LOCATION: Where is the pain located?      Right foot, near the arch  3. PAIN: How bad is the pain?    (Scale 1-10; or mild, moderate, severe)     Hard to bare weight on it, 5-6/10  4. WORK OR EXERCISE: Has there been any recent work or exercise that involved this part of the body?      Denies  5. CAUSE: What do you think is causing the foot pain?     Unsure  6. OTHER SYMPTOMS: Do you have any other symptoms? (e.g., leg pain, rash, fever, numbness)     Denies  Protocols used: Foot Pain-A-AH   Message from [place] [facility] sent at 06/04/2024  8:04 AM EST  Reason for Triage: Right foot in pain can't bare weight on it, pain in bottom middle of foot, pain is a 6

## 2024-06-15 ENCOUNTER — Ambulatory Visit: Admitting: Podiatry

## 2024-06-16 ENCOUNTER — Ambulatory Visit

## 2024-06-28 ENCOUNTER — Ambulatory Visit: Admitting: Family Medicine
